# Patient Record
Sex: Female | Born: 1955 | Race: White | Hispanic: No | Marital: Married | Smoking: Never smoker
Health system: Southern US, Community
[De-identification: ages and names within clinical notes are randomized; demographics above are authoritative.]

## PROBLEM LIST (undated history)

## (undated) DIAGNOSIS — R7303 Prediabetes: Secondary | ICD-10-CM

## (undated) DIAGNOSIS — G4730 Sleep apnea, unspecified: Secondary | ICD-10-CM

## (undated) DIAGNOSIS — K219 Gastro-esophageal reflux disease without esophagitis: Secondary | ICD-10-CM

## (undated) DIAGNOSIS — J189 Pneumonia, unspecified organism: Secondary | ICD-10-CM

## (undated) DIAGNOSIS — I10 Essential (primary) hypertension: Secondary | ICD-10-CM

## (undated) DIAGNOSIS — Z9889 Other specified postprocedural states: Secondary | ICD-10-CM

## (undated) DIAGNOSIS — M1990 Unspecified osteoarthritis, unspecified site: Secondary | ICD-10-CM

## (undated) DIAGNOSIS — N189 Chronic kidney disease, unspecified: Secondary | ICD-10-CM

## (undated) DIAGNOSIS — Z87442 Personal history of urinary calculi: Secondary | ICD-10-CM

## (undated) HISTORY — PX: CHOLECYSTECTOMY: SHX55

## (undated) HISTORY — PX: ABDOMINAL HYSTERECTOMY: SUR658

## (undated) HISTORY — PX: OTHER SURGICAL HISTORY: SHX169

## (undated) HISTORY — PX: TURBINATE REDUCTION: SHX6157

## (undated) HISTORY — PX: PAROTIDECTOMY: SUR1003

## (undated) HISTORY — PX: BACK SURGERY: SHX140

---

## 2006-06-21 ENCOUNTER — Ambulatory Visit (HOSPITAL_COMMUNITY): Admission: RE | Admit: 2006-06-21 | Discharge: 2006-06-21 | Payer: Self-pay | Admitting: Orthopedic Surgery

## 2006-09-11 ENCOUNTER — Ambulatory Visit (HOSPITAL_COMMUNITY): Admission: RE | Admit: 2006-09-11 | Discharge: 2006-09-11 | Payer: Self-pay | Admitting: Orthopedic Surgery

## 2007-09-22 IMAGING — CR DG CHEST 2V
2 series · 2 of 2 positions shown · non-contrast
Comparison: none

CLINICAL DATA: Preoperative respiratory exam for knee surgery.  History of   hypertension and sleep apnea.
 CHEST - 2 VIEW:
 The heart size and mediastinal contours are within normal limits.  Both lungs are clear.  The visualized skeletal structures are unremarkable.

[view not recorded (1 of 2)]
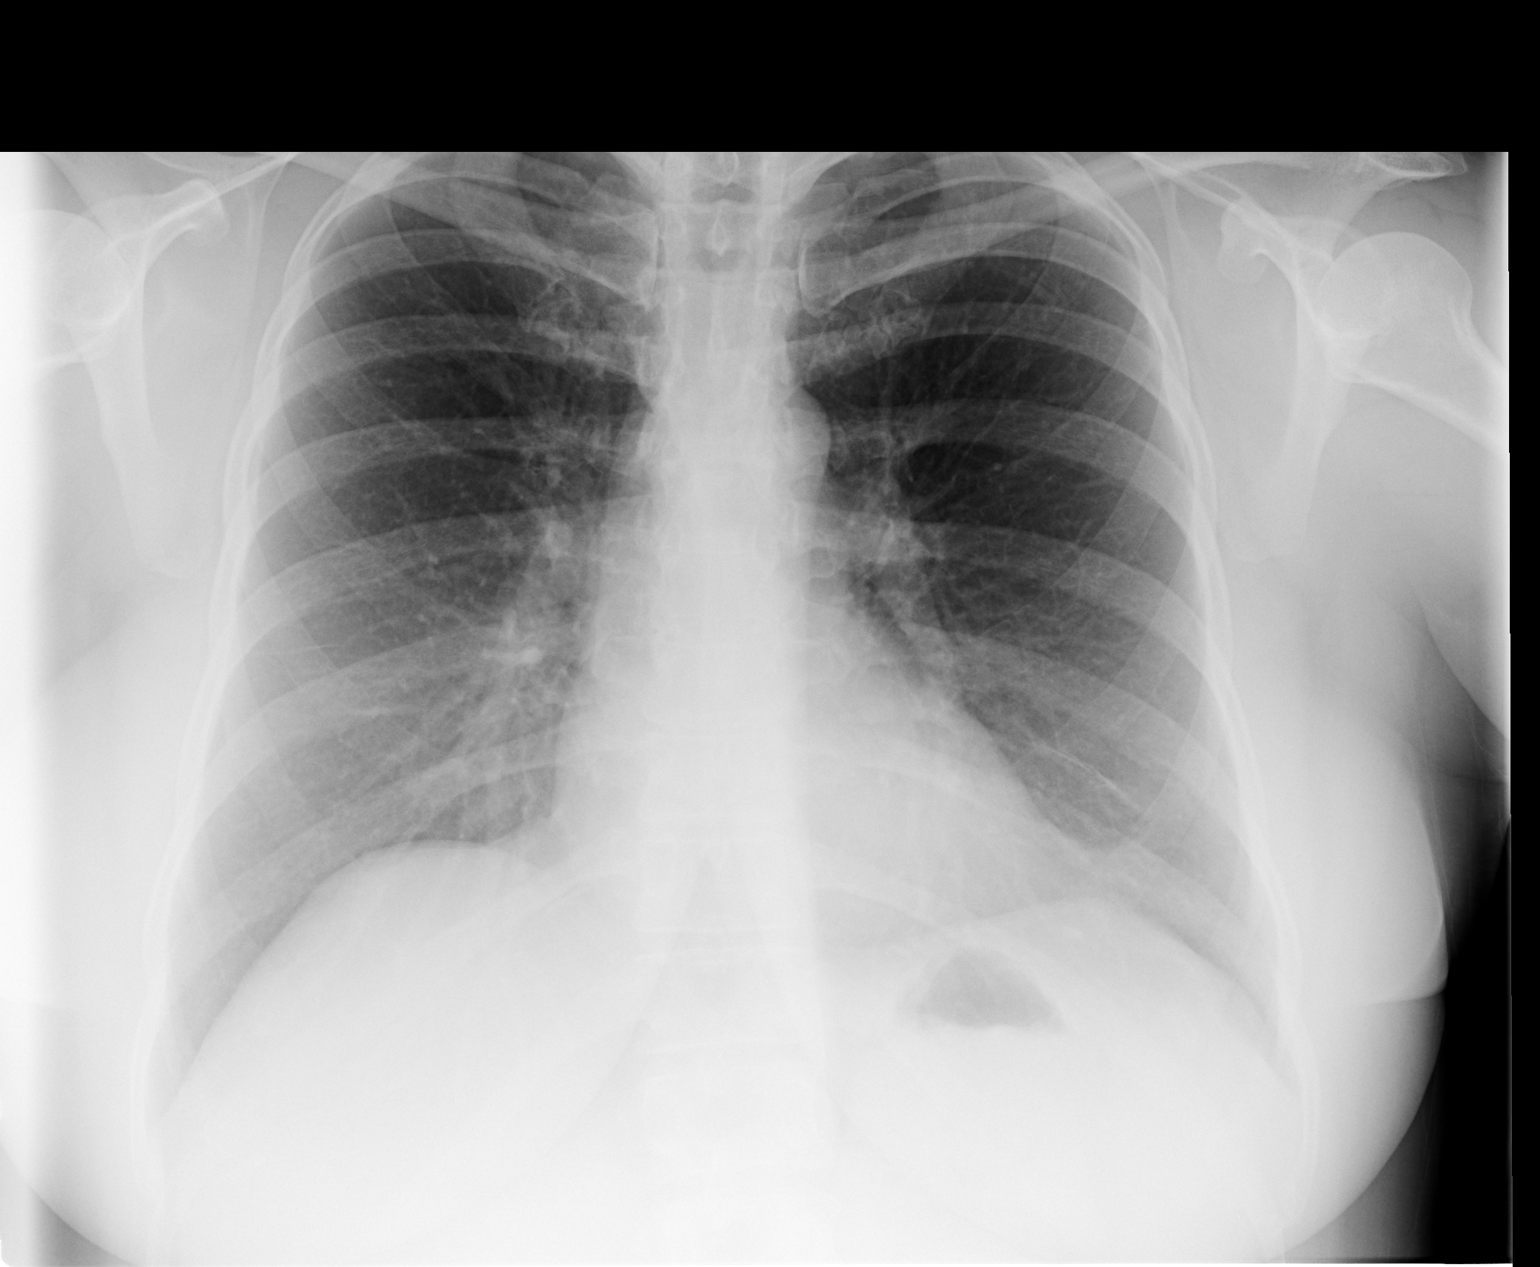

[view not recorded (2 of 2)]
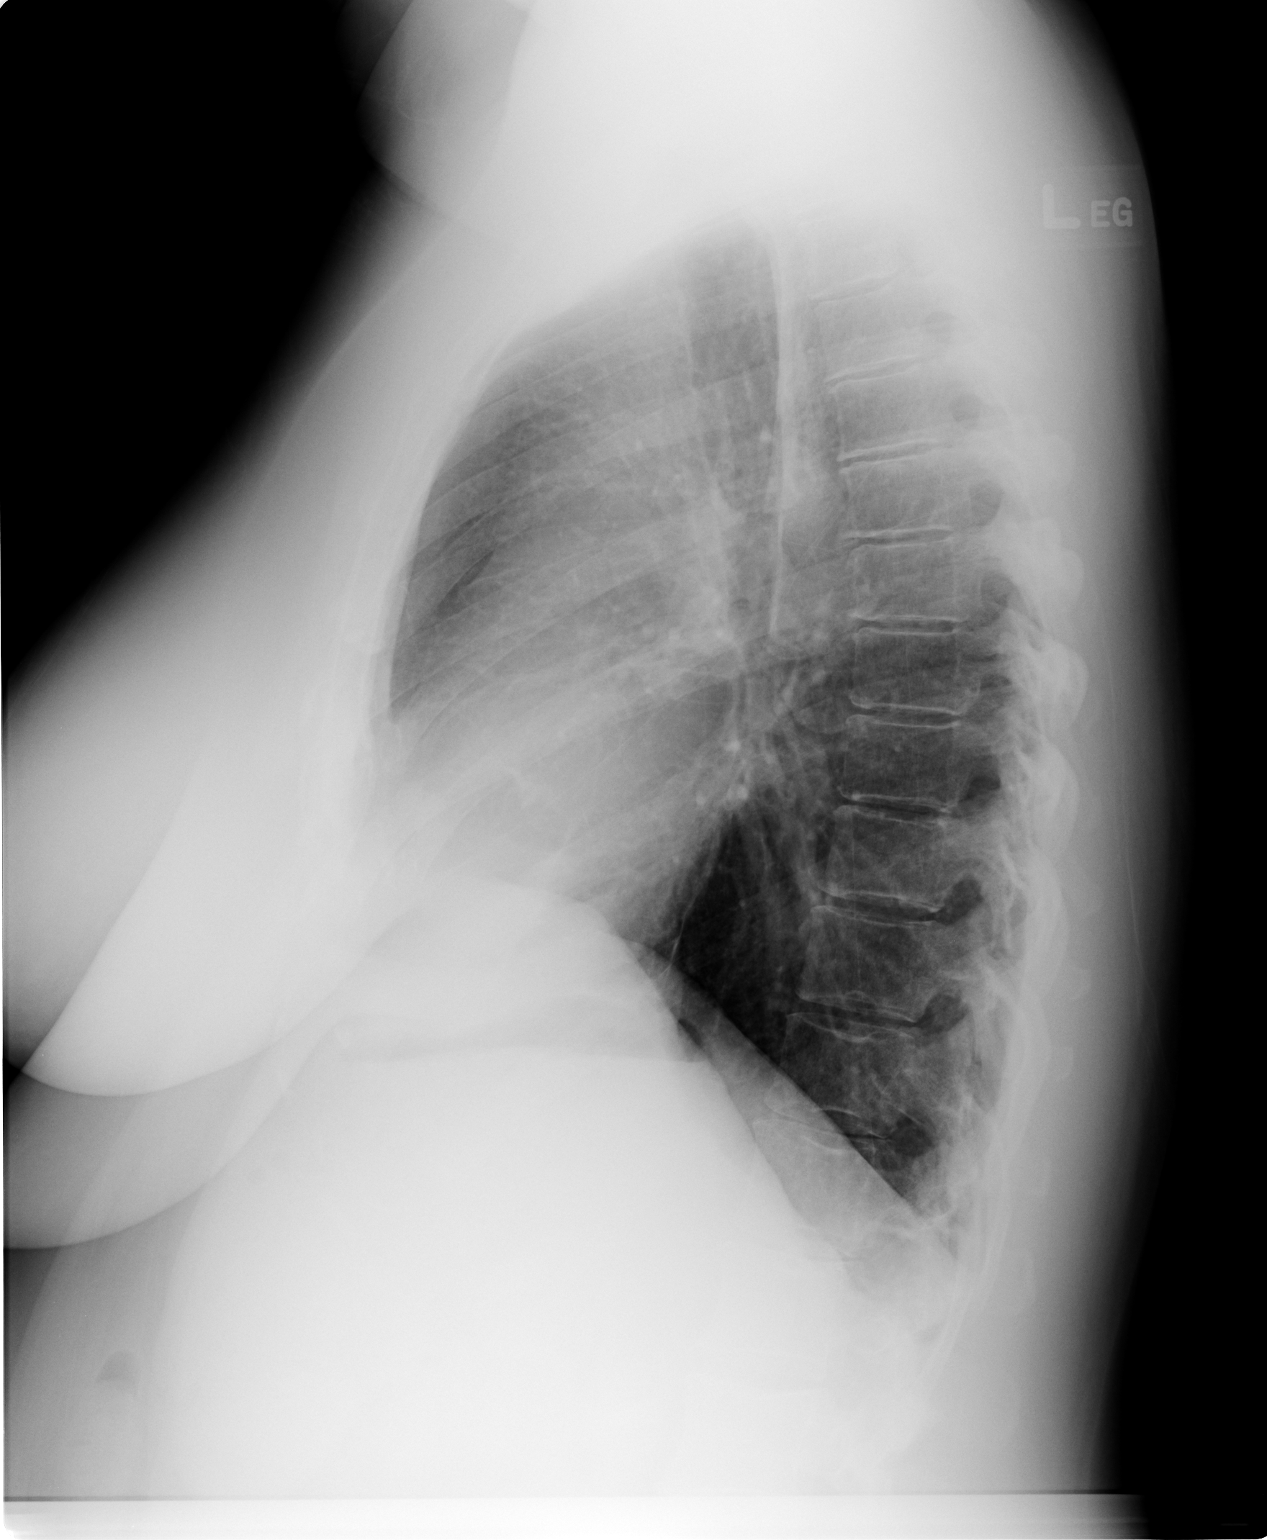

[2 of 2 positions shown; findings below may reference images not displayed]

IMPRESSION: No active cardiopulmonary disease.

## 2012-01-28 ENCOUNTER — Ambulatory Visit: Payer: BC Managed Care – PPO | Attending: Sports Medicine | Admitting: Physical Therapy

## 2012-01-28 DIAGNOSIS — M25559 Pain in unspecified hip: Secondary | ICD-10-CM | POA: Insufficient documentation

## 2012-01-28 DIAGNOSIS — M545 Low back pain, unspecified: Secondary | ICD-10-CM | POA: Insufficient documentation

## 2012-01-28 DIAGNOSIS — R269 Unspecified abnormalities of gait and mobility: Secondary | ICD-10-CM | POA: Insufficient documentation

## 2012-01-28 DIAGNOSIS — IMO0001 Reserved for inherently not codable concepts without codable children: Secondary | ICD-10-CM | POA: Insufficient documentation

## 2012-01-28 DIAGNOSIS — M6281 Muscle weakness (generalized): Secondary | ICD-10-CM | POA: Insufficient documentation

## 2012-01-30 ENCOUNTER — Ambulatory Visit: Payer: BC Managed Care – PPO | Attending: Sports Medicine | Admitting: Physical Therapy

## 2012-01-30 DIAGNOSIS — M6281 Muscle weakness (generalized): Secondary | ICD-10-CM | POA: Insufficient documentation

## 2012-01-30 DIAGNOSIS — M25559 Pain in unspecified hip: Secondary | ICD-10-CM | POA: Insufficient documentation

## 2012-01-30 DIAGNOSIS — R269 Unspecified abnormalities of gait and mobility: Secondary | ICD-10-CM | POA: Insufficient documentation

## 2012-01-30 DIAGNOSIS — M545 Low back pain, unspecified: Secondary | ICD-10-CM | POA: Insufficient documentation

## 2012-01-30 DIAGNOSIS — IMO0001 Reserved for inherently not codable concepts without codable children: Secondary | ICD-10-CM | POA: Insufficient documentation

## 2012-01-31 ENCOUNTER — Encounter: Payer: BC Managed Care – PPO | Admitting: Physical Therapy

## 2012-02-04 ENCOUNTER — Ambulatory Visit: Payer: BC Managed Care – PPO | Admitting: Physical Therapy

## 2012-02-06 ENCOUNTER — Ambulatory Visit: Payer: BC Managed Care – PPO | Admitting: Physical Therapy

## 2012-02-11 ENCOUNTER — Encounter: Payer: BC Managed Care – PPO | Admitting: Physical Therapy

## 2012-02-14 ENCOUNTER — Encounter: Payer: BC Managed Care – PPO | Admitting: Physical Therapy

## 2012-02-18 ENCOUNTER — Other Ambulatory Visit: Payer: Self-pay | Admitting: Sports Medicine

## 2012-02-18 DIAGNOSIS — M545 Low back pain: Secondary | ICD-10-CM

## 2012-02-23 ENCOUNTER — Other Ambulatory Visit: Payer: BC Managed Care – PPO

## 2021-11-25 ENCOUNTER — Other Ambulatory Visit: Payer: Self-pay

## 2021-11-25 DIAGNOSIS — M79641 Pain in right hand: Secondary | ICD-10-CM

## 2021-11-28 ENCOUNTER — Ambulatory Visit (INDEPENDENT_AMBULATORY_CARE_PROVIDER_SITE_OTHER): Payer: BC Managed Care – PPO | Admitting: Vascular Surgery

## 2021-11-28 ENCOUNTER — Ambulatory Visit (HOSPITAL_COMMUNITY)
Admission: RE | Admit: 2021-11-28 | Discharge: 2021-11-28 | Disposition: A | Discharge status: Home / Self Care | Payer: BC Managed Care – PPO | Source: Ambulatory Visit | Attending: Vascular Surgery | Admitting: Vascular Surgery

## 2021-11-28 ENCOUNTER — Encounter: Payer: Self-pay | Admitting: Vascular Surgery

## 2021-11-28 ENCOUNTER — Other Ambulatory Visit: Payer: Self-pay

## 2021-11-28 DIAGNOSIS — M79641 Pain in right hand: Secondary | ICD-10-CM | POA: Insufficient documentation

## 2021-11-28 DIAGNOSIS — M79644 Pain in right finger(s): Secondary | ICD-10-CM | POA: Insufficient documentation

## 2021-11-28 NOTE — Progress Notes (Signed)
Patient name: Meagan Walls MRN: [id number] DOB: 24-Aug-1956 Sex: female  REASON FOR CONSULT: Evaluate right middle finger discoloration  HPI: Meagan Walls is a 66 y.o. female, with history of hypertension that presents for evaluation of right middle finger discoloration.  She was initially seen by Dr. Orlan Leavens with hand surgery last week and I was contacted on call Friday.  Apparently last Wednesday she woke up with a blue right middle finger that felt cold.  She states this happened suddenly.  She states that she did not have significant pain but it took until Sunday almost 5 days for the color to get normal.  This has never happened before.  She denies any tobacco abuse.  No other history of thromboembolic events.  PMH: HTN  PSH: non-contributory  No family history on file.  SOCIAL HISTORY: Social History   Socioeconomic History   Marital status: Married    Spouse name: Not on file   Number of children: Not on file   Years of education: Not on file   Highest education level: Not on file  Occupational History   Not on file  Tobacco Use   Smoking status: Never   Smokeless tobacco: Never  Substance and Sexual Activity   Alcohol use: Never   Drug use: Never   Sexual activity: Not on file  Other Topics Concern   Not on file  Social History Narrative   Not on file   Social Determinants of Health   Financial Resource Strain: Not on file  Food Insecurity: Not on file  Transportation Needs: Not on file  Physical Activity: Not on file  Stress: Not on file  Social Connections: Not on file  Intimate Partner Violence: Not on file    Allergies  Allergen Reactions   Cetirizine Other (See Comments)   Cetirizine Hcl Other (See Comments)    Mouth sores   Erythromycin Base Other (See Comments) and Nausea And Vomiting   Latex Rash   Levofloxacin Other (See Comments)    Muscle pain   Molds & Smuts    Other    Short Ragweed Pollen Ext     No current outpatient  medications on file.   No current facility-administered medications for this visit.    REVIEW OF SYSTEMS:  [X]  denotes positive finding, [ ]  denotes negative finding Cardiac  Comments:  Chest pain or chest pressure:    Shortness of breath upon exertion:    Short of breath when lying flat:    Irregular heart rhythm:        Vascular    Pain in calf, thigh, or hip brought on by ambulation:    Pain in feet at night that wakes you up from your sleep:     Blood clot in your veins:    Leg swelling:         Pulmonary    Oxygen at home:    Productive cough:     Wheezing:         Neurologic    Sudden weakness in arms or legs:     Sudden numbness in arms or legs:     Sudden onset of difficulty speaking or slurred speech:    Temporary loss of vision in one eye:     Problems with dizziness:         Gastrointestinal    Blood in stool:     Vomited blood:         Genitourinary    Burning when urinating:  Blood in urine:        Psychiatric    Major depression:         Hematologic    Bleeding problems:    Problems with blood clotting too easily:        Skin    Rashes or ulcers:        Constitutional    Fever or chills:      PHYSICAL EXAM: Vitals:   11/28/21 1052  BP: 116/78  Pulse: 74  Resp: 14  Temp: 98.3 F (36.8 C)  TempSrc: Temporal  SpO2: 96%  Weight: 241 lb (109.3 kg)  Height: 5\' 3"  (1.6 m)    GENERAL: The patient is a well-nourished female, in no acute distress. The vital signs are documented above. CARDIAC: There is a regular rate and rhythm.  VASCULAR:  Right brachial and radial pulse palpable Right ulnar signal triphasic Right palmar arch signal triphasic Brisk digital signal in the right middle finger with Doppler Right middle finger normal color PULMONARY: No respiratory distress. ABDOMEN: Soft and non-tender. MUSCULOSKELETAL: There are no major deformities or cyanosis. NEUROLOGIC: No focal weakness or paresthesias are detected. SKIN: There  are no ulcers or rashes noted. PSYCHIATRIC: The patient has a normal affect.  DATA:   Right upper extremity arterial duplex shows triphasic waveforms throughout the subclavian, axillary, brachial, radial and ulnar artery with no occlusive disease.  Normal digital tracings in the right hand.     Assessment/Plan:  66 year old female presents for evaluation of a discolored right middle finger.  This happened acutely last Wednesday and took about 5 days to resolve.  Today she has normal coloration of the right middle finger.  She has a palpable radial pulse at the wrist.  She has triphasic ulnar signal at the wrist and a triphasic palmar arch signal as well in the right hand.  Her right upper extremity arterial duplex shows no evidence of inflow disease with normal pulsatile digit tracings as pictured above.  I am a little unclear about the etiology given we would expect Raynaud's from vasospasm to have resolved immediately and not last 5 days.  Discussed it would be appropriate to complete cardioembolic work-up with echocardiogram and CTA chest to ensure she does not have any other proximal lesion that could have been source for embolism.  We will order these through our office.  I will follow-up with her once CT chest and echo are done.  Discussed medical management with 81 mg aspirin daily. No indication for surgical intervention at this time.   Monday, MD [facility] Office: [phone]

## 2021-11-29 ENCOUNTER — Other Ambulatory Visit: Payer: Self-pay

## 2021-11-29 DIAGNOSIS — M79644 Pain in right finger(s): Secondary | ICD-10-CM

## 2021-11-30 ENCOUNTER — Other Ambulatory Visit: Payer: Self-pay

## 2021-11-30 DIAGNOSIS — M79644 Pain in right finger(s): Secondary | ICD-10-CM

## 2021-12-22 ENCOUNTER — Other Ambulatory Visit: Payer: BC Managed Care – PPO

## 2021-12-25 ENCOUNTER — Other Ambulatory Visit: Payer: BC Managed Care – PPO

## 2021-12-26 ENCOUNTER — Ambulatory Visit: Payer: BC Managed Care – PPO | Admitting: Vascular Surgery

## 2022-01-01 ENCOUNTER — Ambulatory Visit (HOSPITAL_COMMUNITY): Payer: BC Managed Care – PPO

## 2022-05-16 NOTE — Progress Notes (Addendum)
Anesthesia Review:  PCP: clearance 02/27/22 on chart - DR Oda Kilts  Cardiologist : none  Chest x-ray : EKG : 05/21/22  Echo : Stress test: Cardiac Cath :  Activity level: can do a flight of stairs without difficulty  Sleep Study/ CPAP : none  Fasting Blood Sugar :      / Checks Blood Sugar -- times a day:   Blood Thinner/ Instructions /Last Dose: ASA / Instructions/ Last Dose :   81 mg aspirin  Prediabetes- does not check gucose at home  Hgba1c- 05/21/22-  Pt was approx 30 minutes late for preop appt.  Med hx and instructions completed.

## 2022-05-17 NOTE — Patient Instructions (Addendum)
SURGICAL WAITING ROOM VISITATION Patients having surgery or a procedure may have no more than 2 support people in the waiting area - these visitors may rotate.   Children under the age of 58 must have an adult with them who is not the patient. If the patient needs to stay at the hospital during part of their recovery, the visitor guidelines for inpatient rooms apply. Pre-op nurse will coordinate an appropriate time for 1 support person to accompany patient in pre-op.  This support person may not rotate.    Please refer to the [facility] website for the visitor guidelines for Inpatients (after your surgery is over and you are in a regular room).       Your procedure is scheduled on:  05/29/22    Report to [facility] Main Entrance    Report to admitting at   [phone]   Call this number if you have problems the morning of surgery [phone]   Do not eat food :After Midnight.   After Midnight you may have the following liquids until __ 0415____ AM  DAY OF SURGERY  Water Non-Citrus Juices (without pulp, NO RED) Carbonated Beverages Black Coffee (NO MILK/CREAM OR CREAMERS, sugar ok)  Clear Tea (NO MILK/CREAM OR CREAMERS, sugar ok) regular and decaf                             Plain Jell-O (NO RED)                                           Fruit ices (not with fruit pulp, NO RED)                                     Popsicles (NO RED)                                                               Sports drinks like Gatorade (NO RED)                      The day of surgery:  Drink ONE (1) Pre-Surgery Clear Ensure or G2 at  0415 AM  ( have completed by ) the morning of surgery. Drink in one sitting. Do not sip.  This drink was given to you during your hospital  pre-op appointment visit. Nothing else to drink after completing the  Pre-Surgery Clear Ensure or G2.          If you have questions, please contact your surgeon's office.       Oral Hygiene is also important to  reduce your risk of infection.                                    Remember - BRUSH YOUR TEETH THE MORNING OF SURGERY WITH YOUR REGULAR TOOTHPASTE   Do NOT smoke after Midnight   Take these medicines the morning of surgery with A SIP OF WATER:   allegra, omeprazole  DO NOT TAKE ANY ORAL DIABETIC MEDICATIONS DAY OF YOUR SURGERY  Bring CPAP mask and tubing day of surgery.                              You may not have any metal on your body including hair pins, jewelry, and body piercing             Do not wear make-up, lotions, powders, perfumes/cologne, or deodorant  Do not wear nail polish including gel and S&S, artificial/acrylic nails, or any other type of covering on natural nails including finger and toenails. If you have artificial nails, gel coating, etc. that needs to be removed by a nail salon please have this removed prior to surgery or surgery may need to be canceled/ delayed if the surgeon/ anesthesia feels like they are unable to be safely monitored.   Do not shave  48 hours prior to surgery.               Men may shave face and neck.   Do not bring valuables to the hospital. [place].   Contacts, dentures or bridgework may not be worn into surgery.   Bring small overnight bag day of surgery.   DO NOT [place]. PHARMACY WILL DISPENSE MEDICATIONS LISTED ON YOUR MEDICATION LIST TO YOU DURING YOUR ADMISSION [place]!    Patients discharged on the day of surgery will not be allowed to drive home.  Someone NEEDS to stay with you for the first 24 hours after anesthesia.   Special Instructions: Bring a copy of your healthcare power of attorney and living will documents         the day of surgery if you haven't scanned them before.              Please read over the following fact sheets you were given: IF YOU HAVE QUESTIONS ABOUT YOUR PRE-OP INSTRUCTIONS PLEASE CALL [phone]      [facility] - Preparing for Surgery Before surgery, you can play an important role.  Because skin is not sterile, your skin needs to be as free of germs as possible.  You can reduce the number of germs on your skin by washing with CHG (chlorahexidine gluconate) soap before surgery.  CHG is an antiseptic cleaner which kills germs and bonds with the skin to continue killing germs even after washing. Please DO NOT use if you have an allergy to CHG or antibacterial soaps.  If your skin becomes reddened/irritated stop using the CHG and inform your nurse when you arrive at Short Stay. Do not shave (including legs and underarms) for at least 48 hours prior to the first CHG shower.  You may shave your face/neck. Please follow these instructions carefully:  1.  Shower with CHG Soap the night before surgery and the  morning of Surgery.  2.  If you choose to wash your hair, wash your hair first as usual with your  normal  shampoo.  3.  After you shampoo, rinse your hair and body thoroughly to remove the  shampoo.                           4.  Use CHG as you would any other liquid soap.  You can apply chg directly  to the skin and wash                       Gently with a scrungie or clean washcloth.  5.  Apply the CHG Soap to your body ONLY FROM THE NECK DOWN.   Do not use on face/ open                           Wound or open sores. Avoid contact with eyes, ears mouth and genitals (private parts).                       Wash face,  Genitals (private parts) with your normal soap.             6.  Wash thoroughly, paying special attention to the area where your surgery  will be performed.  7.  Thoroughly rinse your body with warm water from the neck down.  8.  DO NOT shower/wash with your normal soap after using and rinsing off  the CHG Soap.                9.  Pat yourself dry with a clean towel.            10.  Wear clean pajamas.            11.  Place clean sheets on your bed the night of your first shower and  do not  sleep with pets. Day of Surgery : Do not apply any lotions/deodorants the morning of surgery.  Please wear clean clothes to the hospital/surgery center.  FAILURE TO FOLLOW THESE INSTRUCTIONS MAY RESULT IN THE CANCELLATION OF YOUR SURGERY PATIENT SIGNATURE_________________________________  NURSE SIGNATURE__________________________________  ________________________________________________________________________

## 2022-05-21 ENCOUNTER — Encounter (HOSPITAL_COMMUNITY): Payer: Self-pay

## 2022-05-21 ENCOUNTER — Other Ambulatory Visit: Payer: Self-pay

## 2022-05-21 ENCOUNTER — Encounter (HOSPITAL_COMMUNITY)
Admission: RE | Admit: 2022-05-21 | Discharge: 2022-05-21 | Disposition: A | Discharge status: Home / Self Care | Payer: Medicare PPO | Source: Ambulatory Visit | Attending: Orthopedic Surgery | Admitting: Orthopedic Surgery

## 2022-05-21 VITALS — BP 148/89 | HR 75 | Temp 98.3°F | Resp 16 | Ht 63.0 in | Wt 224.0 lb

## 2022-05-21 DIAGNOSIS — Z01818 Encounter for other preprocedural examination: Secondary | ICD-10-CM | POA: Diagnosis not present

## 2022-05-21 DIAGNOSIS — M1712 Unilateral primary osteoarthritis, left knee: Secondary | ICD-10-CM | POA: Insufficient documentation

## 2022-05-21 HISTORY — DX: Prediabetes: R73.03

## 2022-05-21 HISTORY — DX: Essential (primary) hypertension: I10

## 2022-05-21 HISTORY — DX: Unspecified osteoarthritis, unspecified site: M19.90

## 2022-05-21 HISTORY — DX: Pneumonia, unspecified organism: J18.9

## 2022-05-21 HISTORY — DX: Chronic kidney disease, unspecified: N18.9

## 2022-05-21 HISTORY — DX: Personal history of urinary calculi: Z87.442

## 2022-05-21 HISTORY — DX: Other specified postprocedural states: Z98.890

## 2022-05-21 HISTORY — DX: Gastro-esophageal reflux disease without esophagitis: K21.9

## 2022-05-21 LAB — HEMOGLOBIN A1C
Hgb A1c MFr Bld: 5.8 % — ABNORMAL HIGH (ref 4.8–5.6)
Mean Plasma Glucose: 119.76 mg/dL

## 2022-05-21 LAB — BASIC METABOLIC PANEL
Anion gap: 9 (ref 5–15)
BUN: 24 mg/dL — ABNORMAL HIGH (ref 8–23)
CO2: 23 mmol/L (ref 22–32)
Calcium: 9.3 mg/dL (ref 8.9–10.3)
Chloride: 104 mmol/L (ref 98–111)
Creatinine, Ser: 1.02 mg/dL — ABNORMAL HIGH (ref 0.44–1.00)
GFR, Estimated: >60 mL/min (ref >60)
Glucose, Bld: 100 mg/dL — ABNORMAL HIGH (ref 70–99)
Potassium: 4 mmol/L (ref 3.5–5.1)
Sodium: 136 mmol/L (ref 135–145)

## 2022-05-21 LAB — GLUCOSE, CAPILLARY: Glucose-Capillary: 97 mg/dL (ref 70–99)

## 2022-05-21 LAB — CBC
HCT: 42.9 % (ref 36.0–46.0)
Hemoglobin: 14.4 g/dL (ref 12.0–15.0)
MCH: 31.2 pg (ref 26.0–34.0)
MCHC: 33.6 g/dL (ref 30.0–36.0)
MCV: 93.1 fL (ref 80.0–100.0)
Platelets: 257 10*3/uL (ref 150–400)
RBC: 4.61 MIL/uL (ref 3.87–5.11)
RDW: 12.9 % (ref 11.5–15.5)
WBC: 8.9 10*3/uL (ref 4.0–10.5)
nRBC: 0 % (ref 0.0–0.2)

## 2022-05-21 LAB — SURGICAL PCR SCREEN
MRSA, PCR: NEGATIVE
Staphylococcus aureus: NEGATIVE

## 2022-05-24 NOTE — H&P (Signed)
TOTAL KNEE ADMISSION H&P  Patient is being admitted for left total knee arthroplasty.  Subjective:  Chief Complaint:left knee pain.  HPI: Meagan Walls, 66 y.o. female, has a history of pain and functional disability in the left knee due to arthritis and has failed non-surgical conservative treatments for greater than 12 weeks to includeNSAID's and/or analgesics, corticosteriod injections, viscosupplementation injections, weight reduction as appropriate, and activity modification.  Onset of symptoms was gradual, starting 2 years ago with gradually worsening course since that time. The patient noted no past surgery on the left knee(s).  Patient currently rates pain in the left knee(s) at 8 out of 10 with activity. Patient has worsening of pain with activity and weight bearing, pain that interferes with activities of daily living, and pain with passive range of motion.  Patient has evidence of joint space narrowing by imaging studies. There is no active infection.  Patient Active Problem List   Diagnosis Date Noted   Finger pain, right 11/28/2021   Past Medical History:  Diagnosis Date   Arthritis    Chronic kidney disease    GERD (gastroesophageal reflux disease)    History of kidney stones    Hypertension    Pneumonia    PONV (postoperative nausea and vomiting)    Pre-diabetes     Past Surgical History:  Procedure Laterality Date   ABDOMINAL HYSTERECTOMY     BACK SURGERY     CESAREAN SECTION     CHOLECYSTECTOMY     knee scope surgery      myomectomy     PAROTIDECTOMY     shoulder scope surgery      TURBINATE REDUCTION      No current facility-administered medications for this encounter.   Current Outpatient Medications  Medication Sig Dispense Refill Last Dose   albuterol (VENTOLIN HFA) 108 (90 Base) MCG/ACT inhaler Inhale 2 puffs into the lungs every 6 (six) hours as needed for wheezing or shortness of breath.      aspirin EC 81 MG tablet Take 81 mg by mouth daily.  Swallow whole.      atorvastatin (LIPITOR) 10 MG tablet Take 10 mg by mouth every evening.      Cholecalciferol (VITAMIN D) 50 MCG (2000 UT) tablet Take 4,000 Units by mouth daily.      COLLAGEN PO Take 2 Scoops by mouth daily.      diclofenac (VOLTAREN) 75 MG EC tablet Take 75 mg by mouth 2 (two) times daily.      diphenhydrAMINE (BENADRYL) 25 MG tablet Take 25 mg by mouth at bedtime as needed for allergies.      estradiol (ESTRACE) 0.1 MG/GM vaginal cream Place 1 Applicatorful vaginally once a week.      famotidine-calcium carbonate-magnesium hydroxide (PEPCID COMPLETE) 10-800-165 MG chewable tablet Chew 1 tablet by mouth daily as needed (acid reflux).      fexofenadine (ALLEGRA) 180 MG tablet Take 180 mg by mouth daily.      Guaifenesin (MUCINEX MAXIMUM STRENGTH) 1200 MG TB12 Take 1,200 mg by mouth daily as needed (congestion).      HYDROcodone-acetaminophen (NORCO/VICODIN) 5-325 MG tablet Take 1 tablet by mouth every 8 (eight) hours as needed for moderate pain.      lisinopril-hydrochlorothiazide (ZESTORETIC) 20-25 MG tablet Take 1 tablet by mouth daily.      MAGNESIUM GLYCINATE PO Take 240 mg by mouth daily.      Melatonin 5 MG CAPS Take 5 mg by mouth daily.      metFORMIN (GLUCOPHAGE)  500 MG tablet Take 500 mg by mouth daily.      methocarbamol (ROBAXIN) 500 MG tablet Take 500 mg by mouth See admin instructions. Take 500 mg every evening, may take an additional 500 mg up to 3 more times daily as needed muscle spasms      omeprazole (PRILOSEC) 20 MG capsule Take 20 mg by mouth daily as needed (acid relfux).      OVER THE COUNTER MEDICATION Take 0.25-0.5 capsules by mouth daily as needed (sleep). CBD gummies      oxymetazoline (AFRIN) 0.05 % nasal spray Place 1 spray into both nostrils 2 (two) times daily as needed for congestion.      SUPER B COMPLEX/C PO Take 1 tablet by mouth daily.      traMADol (ULTRAM) 50 MG tablet Take 50 mg by mouth 2 (two) times daily as needed for moderate pain.       triamcinolone (NASACORT ALLERGY 24HR) 55 MCG/ACT AERO nasal inhaler Place 2 sprays into the nose every evening.      TURMERIC PO Take 1,000 mg by mouth 2 (two) times daily.      Allergies  Allergen Reactions   Cetirizine Hcl Other (See Comments)    Mouth sores   Erythromycin Base Nausea And Vomiting   Levofloxacin Other (See Comments)    Muscle pain   Molds & Smuts    Short Ragweed Pollen Ext    Tape Rash    When left on for extended periods     Social History   Tobacco Use   Smoking status: Never   Smokeless tobacco: Never  Substance Use Topics   Alcohol use: Yes    Comment: occas    No family history on file.   Review of Systems  Constitutional:  Negative for chills and fever.  Respiratory:  Negative for cough and shortness of breath.   Cardiovascular:  Negative for chest pain.  Gastrointestinal:  Negative for nausea and vomiting.  Musculoskeletal:  Positive for arthralgias.     Objective:  Physical Exam Well nourished and well developed. General: Alert and oriented x3, cooperative and pleasant, no acute distress. Head: normocephalic, atraumatic, neck supple. Eyes: EOMI.  Musculoskeletal: Bilateral knee exams: No palpable effusions, warmth erythema Slight flexion contractures with flexion over 110 degrees Tenderness over the medial and anterior aspect knees No significant lower extremity edema or calf tenderness  Calves soft and nontender. Motor function intact in LE. Strength 5/5 LE bilaterally. Neuro: Distal pulses 2+. Sensation to light touch intact in LE.  Vital signs in last 24 hours:    Labs:   Estimated body mass index is 39.68 kg/m as calculated from the following:   Height as of 05/21/22: 5\' 3"  (1.6 m).   Weight as of 05/21/22: 101.6 kg.   Imaging Review Plain radiographs demonstrate severe degenerative joint disease of the left knee(s). The overall alignment isneutral. The bone quality appears to be adequate for age and reported activity  level.      Assessment/Plan:  End stage arthritis, left knee   The patient history, physical examination, clinical judgment of the provider and imaging studies are consistent with end stage degenerative joint disease of the left knee(s) and total knee arthroplasty is deemed medically necessary. The treatment options including medical management, injection therapy arthroscopy and arthroplasty were discussed at length. The risks and benefits of total knee arthroplasty were presented and reviewed. The risks due to aseptic loosening, infection, stiffness, patella tracking problems, thromboembolic complications and other imponderables were  discussed. The patient acknowledged the explanation, agreed to proceed with the plan and consent was signed. Patient is being admitted for inpatient treatment for surgery, pain control, PT, OT, prophylactic antibiotics, VTE prophylaxis, progressive ambulation and ADL's and discharge planning. The patient is planning to be discharged  home.  Therapy Plans: outpatient therapy at [facility] in [place] Disposition: Home with husband Planned DVT Prophylaxis: aspirin 81mg  BID DME needed: walker PCP: Dr. , clearance received TXA: IV Allergies: erythromycin - abdominal pain, zyrtec - , morphine - N/V Anesthesia Concerns: none BMI: 39.7 Last HgbA1c: Pre-diabetic, 6.4%   Other: - Will try aquacel - has sensitive skin - oxycodone, robaxin, tylenol, celebrex - Lost 7 lbs in the past 2-3 weeks! > congratulate    Patient's anticipated LOS is less than 2 midnights, meeting these requirements: - Younger than 81 - Lives within 1 hour of care - Has a competent adult at home to recover with post-op recover - NO history of  - Chronic pain requiring opiods  - Diabetes  - Coronary Artery Disease  - Heart failure  - Heart attack  - Stroke  - DVT/VTE  - Cardiac arrhythmia  - Respiratory Failure/COPD  - Renal failure  - Anemia  - Advanced Liver  disease  76, PA-C Orthopedic Surgery [facility] [place] [phone]

## 2022-05-28 ENCOUNTER — Encounter (HOSPITAL_COMMUNITY): Payer: Self-pay | Admitting: Orthopedic Surgery

## 2022-05-28 NOTE — Anesthesia Preprocedure Evaluation (Signed)
Anesthesia Evaluation  Patient identified by MRN, date of birth, ID band Patient awake    Reviewed: Allergy & Precautions, NPO status , Patient's Chart, lab work & pertinent test results  History of Anesthesia Complications (+) PONV  Airway Mallampati: III  TM Distance: >3 FB Neck ROM: Full    Dental  (+) Teeth Intact, Dental Advisory Given   Pulmonary    breath sounds clear to auscultation       Cardiovascular hypertension, Pt. on medications  Rhythm:Regular Rate:Normal     Neuro/Psych negative neurological ROS  negative psych ROS   GI/Hepatic Neg liver ROS, GERD  Medicated,  Endo/Other  diabetes, Type 2, Oral Hypoglycemic Agents  Renal/GU Renal disease     Musculoskeletal  (+) Arthritis ,   Abdominal Normal abdominal exam  (+)   Peds  Hematology negative hematology ROS (+)   Anesthesia Other Findings   Reproductive/Obstetrics                            Anesthesia Physical Anesthesia Plan  ASA: 2  Anesthesia Plan: Spinal   Post-op Pain Management: Regional block*   Induction: Intravenous  PONV Risk Score and Plan: 3 and Ondansetron, Dexamethasone, Propofol infusion and Midazolam  Airway Management Planned: Natural Airway and Simple Face Mask  Additional Equipment: None  Intra-op Plan:   Post-operative Plan:   Informed Consent: I have reviewed the patients History and Physical, chart, labs and discussed the procedure including the risks, benefits and alternatives for the proposed anesthesia with the patient or authorized representative who has indicated his/her understanding and acceptance.     Dental advisory given  Plan Discussed with:   Anesthesia Plan Comments: (Lab Results      Component                Value               Date                      WBC                      8.9                 05/21/2022                HGB                      14.4                 05/21/2022                HCT                      42.9                05/21/2022                MCV                      93.1                05/21/2022                PLT                      257  05/21/2022           )       Anesthesia Quick Evaluation

## 2022-05-29 ENCOUNTER — Ambulatory Visit (HOSPITAL_COMMUNITY): Payer: Medicare PPO | Admitting: Physician Assistant

## 2022-05-29 ENCOUNTER — Other Ambulatory Visit: Payer: Self-pay

## 2022-05-29 ENCOUNTER — Encounter (HOSPITAL_COMMUNITY)
Admission: RE | Disposition: A | Discharge status: Home / Self Care | Payer: Self-pay | Source: Home / Self Care | Attending: Orthopedic Surgery

## 2022-05-29 ENCOUNTER — Encounter (HOSPITAL_COMMUNITY): Payer: Self-pay | Admitting: Orthopedic Surgery

## 2022-05-29 ENCOUNTER — Observation Stay (HOSPITAL_COMMUNITY)
Admission: RE | Admit: 2022-05-29 | Discharge: 2022-05-30 | Disposition: A | Discharge status: Home / Self Care | Payer: Medicare PPO | Attending: Orthopedic Surgery | Admitting: Orthopedic Surgery

## 2022-05-29 ENCOUNTER — Ambulatory Visit (HOSPITAL_BASED_OUTPATIENT_CLINIC_OR_DEPARTMENT_OTHER): Payer: Medicare PPO | Admitting: Anesthesiology

## 2022-05-29 DIAGNOSIS — Z79899 Other long term (current) drug therapy: Secondary | ICD-10-CM | POA: Insufficient documentation

## 2022-05-29 DIAGNOSIS — M1712 Unilateral primary osteoarthritis, left knee: Secondary | ICD-10-CM | POA: Diagnosis not present

## 2022-05-29 DIAGNOSIS — N189 Chronic kidney disease, unspecified: Secondary | ICD-10-CM | POA: Diagnosis not present

## 2022-05-29 DIAGNOSIS — Z7984 Long term (current) use of oral hypoglycemic drugs: Secondary | ICD-10-CM | POA: Insufficient documentation

## 2022-05-29 DIAGNOSIS — Z7982 Long term (current) use of aspirin: Secondary | ICD-10-CM | POA: Insufficient documentation

## 2022-05-29 DIAGNOSIS — Z96652 Presence of left artificial knee joint: Secondary | ICD-10-CM

## 2022-05-29 DIAGNOSIS — I129 Hypertensive chronic kidney disease with stage 1 through stage 4 chronic kidney disease, or unspecified chronic kidney disease: Secondary | ICD-10-CM | POA: Insufficient documentation

## 2022-05-29 DIAGNOSIS — Z01818 Encounter for other preprocedural examination: Secondary | ICD-10-CM

## 2022-05-29 HISTORY — PX: TOTAL KNEE ARTHROPLASTY: SHX125

## 2022-05-29 LAB — TYPE AND SCREEN
ABO/RH(D): A POS
Antibody Screen: NEGATIVE

## 2022-05-29 SURGERY — ARTHROPLASTY, KNEE, TOTAL
Anesthesia: Spinal | Site: Knee | Laterality: Left

## 2022-05-29 MED ORDER — MEPERIDINE HCL 50 MG/ML IJ SOLN: 6.2500 mg | INTRAMUSCULAR | Status: DC | PRN

## 2022-05-29 MED ORDER — SODIUM CHLORIDE (PF) 0.9 % IJ SOLN
INTRAMUSCULAR | Status: DC | PRN
  Administered 2022-05-29: 30 mL

## 2022-05-29 MED ORDER — LISINOPRIL 20 MG PO TABS
20.0000 mg | ORAL_TABLET | Freq: Every day | ORAL | Status: DC
  Administered 2022-05-30: 20 mg via ORAL
  Filled 2022-05-29: qty 1

## 2022-05-29 MED ORDER — LORATADINE 10 MG PO TABS
10.0000 mg | ORAL_TABLET | Freq: Every day | ORAL | Status: DC
  Administered 2022-05-30: 10 mg via ORAL
  Filled 2022-05-29: qty 1

## 2022-05-29 MED ORDER — PANTOPRAZOLE SODIUM 40 MG PO TBEC
40.0000 mg | DELAYED_RELEASE_TABLET | Freq: Every day | ORAL | Status: DC
  Administered 2022-05-30: 40 mg via ORAL
  Filled 2022-05-29: qty 1

## 2022-05-29 MED ORDER — ASPIRIN 81 MG PO CHEW
81.0000 mg | CHEWABLE_TABLET | Freq: Two times a day (BID) | ORAL | Status: DC
  Administered 2022-05-29 – 2022-05-30 (×2): 81 mg via ORAL
  Filled 2022-05-29 (×2): qty 1

## 2022-05-29 MED ORDER — DEXAMETHASONE SODIUM PHOSPHATE 10 MG/ML IJ SOLN
10.0000 mg | Freq: Once | INTRAMUSCULAR | Status: AC
Start: 2022-05-30 — End: 2022-05-30
  Administered 2022-05-30: 10 mg via INTRAVENOUS
  Filled 2022-05-29: qty 1

## 2022-05-29 MED ORDER — TRANEXAMIC ACID-NACL 1000-0.7 MG/100ML-% IV SOLN
1000.0000 mg | INTRAVENOUS | Status: AC
  Administered 2022-05-29: 1000 mg via INTRAVENOUS
  Filled 2022-05-29: qty 100

## 2022-05-29 MED ORDER — DEXMEDETOMIDINE (PRECEDEX) IN NS 20 MCG/5ML (4 MCG/ML) IV SYRINGE
PREFILLED_SYRINGE | INTRAVENOUS | Status: DC | PRN
  Administered 2022-05-29: 12 ug via INTRAVENOUS

## 2022-05-29 MED ORDER — ORAL CARE MOUTH RINSE: 15.0000 mL | Freq: Once | OROMUCOSAL | Status: AC

## 2022-05-29 MED ORDER — DIPHENHYDRAMINE HCL 25 MG PO TABS: 25.0000 mg | ORAL_TABLET | Freq: Every evening | ORAL | Status: DC | PRN

## 2022-05-29 MED ORDER — MELATONIN 5 MG PO TABS
5.0000 mg | ORAL_TABLET | Freq: Every day | ORAL | Status: DC
Start: 2022-05-29 — End: 2022-05-30
  Administered 2022-05-29: 5 mg via ORAL
  Filled 2022-05-29: qty 1

## 2022-05-29 MED ORDER — PHENYLEPHRINE HCL-NACL 20-0.9 MG/250ML-% IV SOLN
INTRAVENOUS | Status: DC | PRN
  Administered 2022-05-29: 25 ug/min via INTRAVENOUS

## 2022-05-29 MED ORDER — KETOROLAC TROMETHAMINE 30 MG/ML IJ SOLN
INTRAMUSCULAR | Status: AC
  Filled 2022-05-29: qty 1

## 2022-05-29 MED ORDER — FENTANYL CITRATE (PF) 100 MCG/2ML IJ SOLN
INTRAMUSCULAR | Status: DC | PRN
  Administered 2022-05-29 (×2): 50 ug via INTRAVENOUS

## 2022-05-29 MED ORDER — SODIUM CHLORIDE 0.9 % IR SOLN
Status: DC | PRN
  Administered 2022-05-29: 1000 mL

## 2022-05-29 MED ORDER — MIDAZOLAM HCL 2 MG/2ML IJ SOLN
INTRAMUSCULAR | Status: DC | PRN
  Administered 2022-05-29: 2 mg via INTRAVENOUS

## 2022-05-29 MED ORDER — GUAIFENESIN ER 600 MG PO TB12: 1200.0000 mg | ORAL_TABLET | Freq: Every day | ORAL | Status: DC | PRN

## 2022-05-29 MED ORDER — DIPHENHYDRAMINE HCL 12.5 MG/5ML PO ELIX: 12.5000 mg | ORAL_SOLUTION | ORAL | Status: DC | PRN

## 2022-05-29 MED ORDER — ROPIVACAINE HCL 5 MG/ML IJ SOLN
INTRAMUSCULAR | Status: DC | PRN
  Administered 2022-05-29: 30 mL via PERINEURAL

## 2022-05-29 MED ORDER — PROPOFOL 10 MG/ML IV BOLUS
INTRAVENOUS | Status: AC
  Filled 2022-05-29: qty 20

## 2022-05-29 MED ORDER — DOCUSATE SODIUM 100 MG PO CAPS
100.0000 mg | ORAL_CAPSULE | Freq: Two times a day (BID) | ORAL | Status: DC
  Administered 2022-05-29 – 2022-05-30 (×2): 100 mg via ORAL
  Filled 2022-05-29 (×2): qty 1

## 2022-05-29 MED ORDER — ACETAMINOPHEN 160 MG/5ML PO SOLN: 325.0000 mg | Freq: Once | ORAL | Status: DC | PRN

## 2022-05-29 MED ORDER — METHOCARBAMOL 500 MG PO TABS
500.0000 mg | ORAL_TABLET | Freq: Four times a day (QID) | ORAL | Status: DC | PRN
  Administered 2022-05-29 – 2022-05-30 (×3): 500 mg via ORAL
  Filled 2022-05-29 (×3): qty 1

## 2022-05-29 MED ORDER — METOCLOPRAMIDE HCL 5 MG/ML IJ SOLN
5.0000 mg | Freq: Three times a day (TID) | INTRAMUSCULAR | Status: DC | PRN
  Administered 2022-05-29 – 2022-05-30 (×2): 10 mg via INTRAVENOUS
  Filled 2022-05-29 (×2): qty 2

## 2022-05-29 MED ORDER — OXYMETAZOLINE HCL 0.05 % NA SOLN
1.0000 | Freq: Two times a day (BID) | NASAL | Status: DC | PRN
  Administered 2022-05-30: 1 via NASAL
  Filled 2022-05-29: qty 15

## 2022-05-29 MED ORDER — ONDANSETRON HCL 4 MG PO TABS
4.0000 mg | ORAL_TABLET | Freq: Four times a day (QID) | ORAL | Status: DC | PRN
  Administered 2022-05-29 – 2022-05-30 (×2): 4 mg via ORAL
  Filled 2022-05-29 (×2): qty 1

## 2022-05-29 MED ORDER — SODIUM CHLORIDE (PF) 0.9 % IJ SOLN
INTRAMUSCULAR | Status: AC
  Filled 2022-05-29: qty 30

## 2022-05-29 MED ORDER — KETOROLAC TROMETHAMINE 30 MG/ML IJ SOLN
INTRAMUSCULAR | Status: DC | PRN
  Administered 2022-05-29: 30 mg

## 2022-05-29 MED ORDER — AMISULPRIDE (ANTIEMETIC) 5 MG/2ML IV SOLN: 10.0000 mg | Freq: Once | INTRAVENOUS | Status: DC | PRN

## 2022-05-29 MED ORDER — CHLORHEXIDINE GLUCONATE 0.12 % MT SOLN
15.0000 mL | Freq: Once | OROMUCOSAL | Status: AC
  Administered 2022-05-29: 15 mL via OROMUCOSAL

## 2022-05-29 MED ORDER — FERROUS SULFATE 325 (65 FE) MG PO TABS
325.0000 mg | ORAL_TABLET | Freq: Three times a day (TID) | ORAL | Status: DC
  Administered 2022-05-30 (×2): 325 mg via ORAL
  Filled 2022-05-29 (×2): qty 1

## 2022-05-29 MED ORDER — PROPOFOL 500 MG/50ML IV EMUL
INTRAVENOUS | Status: DC | PRN
  Administered 2022-05-29: 75 ug/kg/min via INTRAVENOUS
  Administered 2022-05-29: 30 mg via INTRAVENOUS

## 2022-05-29 MED ORDER — BUPIVACAINE-EPINEPHRINE (PF) 0.25% -1:200000 IJ SOLN
INTRAMUSCULAR | Status: DC | PRN
  Administered 2022-05-29: 30 mL

## 2022-05-29 MED ORDER — FAMOTIDINE 10 MG PO TABS: 10.0000 mg | ORAL_TABLET | Freq: Every day | ORAL | Status: DC | PRN

## 2022-05-29 MED ORDER — ATORVASTATIN CALCIUM 10 MG PO TABS
10.0000 mg | ORAL_TABLET | Freq: Every evening | ORAL | Status: DC
  Administered 2022-05-29: 10 mg via ORAL
  Filled 2022-05-29: qty 1

## 2022-05-29 MED ORDER — MENTHOL 3 MG MT LOZG
1.0000 | LOZENGE | OROMUCOSAL | Status: DC | PRN
  Filled 2022-05-29: qty 9

## 2022-05-29 MED ORDER — METFORMIN HCL 500 MG PO TABS
500.0000 mg | ORAL_TABLET | Freq: Every day | ORAL | Status: DC
  Administered 2022-05-30: 500 mg via ORAL
  Filled 2022-05-29: qty 1

## 2022-05-29 MED ORDER — ACETAMINOPHEN 10 MG/ML IV SOLN: 1000.0000 mg | Freq: Once | INTRAVENOUS | Status: DC | PRN

## 2022-05-29 MED ORDER — BUPIVACAINE IN DEXTROSE 0.75-8.25 % IT SOLN
INTRATHECAL | Status: DC | PRN
  Administered 2022-05-29: 1.6 mL via INTRATHECAL

## 2022-05-29 MED ORDER — 0.9 % SODIUM CHLORIDE (POUR BTL) OPTIME
TOPICAL | Status: DC | PRN
  Administered 2022-05-29: 1000 mL

## 2022-05-29 MED ORDER — TRANEXAMIC ACID-NACL 1000-0.7 MG/100ML-% IV SOLN
1000.0000 mg | Freq: Once | INTRAVENOUS | Status: AC
  Administered 2022-05-29: 1000 mg via INTRAVENOUS
  Filled 2022-05-29: qty 100

## 2022-05-29 MED ORDER — PHENYLEPHRINE HCL-NACL 20-0.9 MG/250ML-% IV SOLN
INTRAVENOUS | Status: AC
  Filled 2022-05-29: qty 500

## 2022-05-29 MED ORDER — LACTATED RINGERS IV SOLN: INTRAVENOUS | Status: DC

## 2022-05-29 MED ORDER — PHENOL 1.4 % MT LIQD
1.0000 | OROMUCOSAL | Status: DC | PRN
  Filled 2022-05-29: qty 177

## 2022-05-29 MED ORDER — SODIUM CHLORIDE 0.9 % IV SOLN: INTRAVENOUS | Status: DC

## 2022-05-29 MED ORDER — ACETAMINOPHEN 325 MG PO TABS: 325.0000 mg | ORAL_TABLET | Freq: Once | ORAL | Status: DC | PRN

## 2022-05-29 MED ORDER — HYDROCHLOROTHIAZIDE 25 MG PO TABS
25.0000 mg | ORAL_TABLET | Freq: Every day | ORAL | Status: DC
  Administered 2022-05-30: 25 mg via ORAL
  Filled 2022-05-29: qty 1

## 2022-05-29 MED ORDER — HYDROMORPHONE HCL 1 MG/ML IJ SOLN
0.5000 mg | INTRAMUSCULAR | Status: DC | PRN
  Administered 2022-05-29: 1 mg via INTRAVENOUS
  Filled 2022-05-29: qty 1

## 2022-05-29 MED ORDER — POLYETHYLENE GLYCOL 3350 17 G PO PACK: 17.0000 g | PACK | Freq: Every day | ORAL | Status: DC | PRN

## 2022-05-29 MED ORDER — FENTANYL CITRATE (PF) 100 MCG/2ML IJ SOLN
INTRAMUSCULAR | Status: AC
  Filled 2022-05-29: qty 2

## 2022-05-29 MED ORDER — CEFAZOLIN SODIUM-DEXTROSE 2-4 GM/100ML-% IV SOLN
2.0000 g | INTRAVENOUS | Status: AC
  Administered 2022-05-29: 2 g via INTRAVENOUS
  Filled 2022-05-29: qty 100

## 2022-05-29 MED ORDER — OXYCODONE HCL 5 MG PO TABS
10.0000 mg | ORAL_TABLET | ORAL | Status: DC | PRN
  Administered 2022-05-30: 10 mg via ORAL
  Filled 2022-05-29 (×2): qty 2

## 2022-05-29 MED ORDER — OXYCODONE HCL 5 MG PO TABS
5.0000 mg | ORAL_TABLET | ORAL | Status: DC | PRN
  Administered 2022-05-29: 5 mg via ORAL
  Administered 2022-05-29 – 2022-05-30 (×3): 10 mg via ORAL
  Filled 2022-05-29: qty 1
  Filled 2022-05-29 (×3): qty 2

## 2022-05-29 MED ORDER — DEXAMETHASONE SODIUM PHOSPHATE 10 MG/ML IJ SOLN: 8.0000 mg | Freq: Once | INTRAMUSCULAR | Status: DC

## 2022-05-29 MED ORDER — METOCLOPRAMIDE HCL 5 MG PO TABS: 5.0000 mg | ORAL_TABLET | Freq: Three times a day (TID) | ORAL | Status: DC | PRN

## 2022-05-29 MED ORDER — ONDANSETRON HCL 4 MG/2ML IJ SOLN: 4.0000 mg | Freq: Four times a day (QID) | INTRAMUSCULAR | Status: DC | PRN

## 2022-05-29 MED ORDER — HYDROMORPHONE HCL 1 MG/ML IJ SOLN: 0.2500 mg | INTRAMUSCULAR | Status: DC | PRN

## 2022-05-29 MED ORDER — ACETAMINOPHEN 500 MG PO TABS
1000.0000 mg | ORAL_TABLET | Freq: Four times a day (QID) | ORAL | Status: DC
  Administered 2022-05-29 – 2022-05-30 (×4): 1000 mg via ORAL
  Filled 2022-05-29 (×4): qty 2

## 2022-05-29 MED ORDER — TRIAMCINOLONE ACETONIDE 55 MCG/ACT NA AERO
2.0000 | INHALATION_SPRAY | Freq: Every evening | NASAL | Status: DC
  Administered 2022-05-29: 2 via NASAL
  Filled 2022-05-29: qty 21.6

## 2022-05-29 MED ORDER — CEFAZOLIN SODIUM-DEXTROSE 2-4 GM/100ML-% IV SOLN
2.0000 g | Freq: Four times a day (QID) | INTRAVENOUS | Status: AC
  Administered 2022-05-29 (×2): 2 g via INTRAVENOUS
  Filled 2022-05-29 (×2): qty 100

## 2022-05-29 MED ORDER — ALBUTEROL SULFATE (2.5 MG/3ML) 0.083% IN NEBU: 2.5000 mg | INHALATION_SOLUTION | Freq: Four times a day (QID) | RESPIRATORY_TRACT | Status: DC | PRN

## 2022-05-29 MED ORDER — METHOCARBAMOL 1000 MG/10ML IJ SOLN: 500.0000 mg | Freq: Four times a day (QID) | INTRAVENOUS | Status: DC | PRN

## 2022-05-29 MED ORDER — MIDAZOLAM HCL 2 MG/2ML IJ SOLN
INTRAMUSCULAR | Status: AC
  Filled 2022-05-29: qty 2

## 2022-05-29 MED ORDER — BISACODYL 10 MG RE SUPP: 10.0000 mg | Freq: Every day | RECTAL | Status: DC | PRN

## 2022-05-29 MED ORDER — BUPIVACAINE-EPINEPHRINE (PF) 0.25% -1:200000 IJ SOLN
INTRAMUSCULAR | Status: AC
  Filled 2022-05-29: qty 30

## 2022-05-29 MED ORDER — DEXMEDETOMIDINE HCL IN NACL 80 MCG/20ML IV SOLN
INTRAVENOUS | Status: AC
  Filled 2022-05-29: qty 20

## 2022-05-29 MED ORDER — POVIDONE-IODINE 10 % EX SWAB
2.0000 | Freq: Once | CUTANEOUS | Status: AC
  Administered 2022-05-29: 2 via TOPICAL

## 2022-05-29 MED ORDER — DEXAMETHASONE SODIUM PHOSPHATE 10 MG/ML IJ SOLN
INTRAMUSCULAR | Status: DC | PRN
  Administered 2022-05-29: 10 mg via INTRAVENOUS

## 2022-05-29 MED ORDER — LISINOPRIL-HYDROCHLOROTHIAZIDE 20-25 MG PO TABS: 1.0000 | ORAL_TABLET | Freq: Every day | ORAL | Status: DC

## 2022-05-29 MED ORDER — PROMETHAZINE HCL 25 MG/ML IJ SOLN: 6.2500 mg | INTRAMUSCULAR | Status: DC | PRN

## 2022-05-29 MED ORDER — LIDOCAINE 2% (20 MG/ML) 5 ML SYRINGE
INTRAMUSCULAR | Status: DC | PRN
  Administered 2022-05-29: 60 mg via INTRAVENOUS

## 2022-05-29 SURGICAL SUPPLY — 53 items
ATTUNE MED ANAT PAT 32 KNEE (Knees) IMPLANT
ATTUNE PSFEM LTSZ5 NARCEM KNEE (Femur) IMPLANT
ATTUNE PSRP INSR SZ5 5 KNEE (Insert) IMPLANT
BAG COUNTER SPONGE SURGICOUNT (BAG) IMPLANT
BAG ZIPLOCK 12X15 (MISCELLANEOUS) IMPLANT
BASEPLATE TIBIAL ROTATING SZ 4 (Knees) IMPLANT
BLADE SAW SGTL 11.0X1.19X90.0M (BLADE) IMPLANT
BLADE SAW SGTL 13.0X1.19X90.0M (BLADE) ×1 IMPLANT
BNDG ELASTIC 6X5.8 VLCR STR LF (GAUZE/BANDAGES/DRESSINGS) ×1 IMPLANT
BOWL SMART MIX CTS (DISPOSABLE) ×1 IMPLANT
CEMENT HV SMART SET (Cement) IMPLANT
CUFF TOURN SGL QUICK 34 (TOURNIQUET CUFF) ×1
CUFF TRNQT CYL 34X4.125X (TOURNIQUET CUFF) ×1 IMPLANT
DERMABOND ADVANCED (GAUZE/BANDAGES/DRESSINGS) ×1
DERMABOND ADVANCED .7 DNX12 (GAUZE/BANDAGES/DRESSINGS) ×1 IMPLANT
DRAPE SHEET LG 3/4 BI-LAMINATE (DRAPES) ×1 IMPLANT
DRAPE U-SHAPE 47X51 STRL (DRAPES) ×1 IMPLANT
DRESSING AQUACEL AG SP 3.5X10 (GAUZE/BANDAGES/DRESSINGS) ×1 IMPLANT
DRSG AQUACEL AG ADV 3.5X10 (GAUZE/BANDAGES/DRESSINGS) IMPLANT
DRSG AQUACEL AG SP 3.5X10 (GAUZE/BANDAGES/DRESSINGS) ×1
DURAPREP 26ML APPLICATOR (WOUND CARE) ×2 IMPLANT
ELECT REM PT RETURN 15FT ADLT (MISCELLANEOUS) ×1 IMPLANT
GLOVE BIO SURGEON STRL SZ 6 (GLOVE) ×1 IMPLANT
GLOVE BIOGEL PI IND STRL 6.5 (GLOVE) ×1 IMPLANT
GLOVE BIOGEL PI IND STRL 7.5 (GLOVE) ×1 IMPLANT
GLOVE BIOGEL PI INDICATOR 6.5 (GLOVE) ×1
GLOVE BIOGEL PI INDICATOR 7.5 (GLOVE) ×1
GLOVE ORTHO TXT STRL SZ7.5 (GLOVE) ×2 IMPLANT
GOWN STRL REUS W/ TWL LRG LVL3 (GOWN DISPOSABLE) ×2 IMPLANT
GOWN STRL REUS W/TWL LRG LVL3 (GOWN DISPOSABLE) ×2
HANDPIECE INTERPULSE COAX TIP (DISPOSABLE) ×1
HOLDER FOLEY CATH W/STRAP (MISCELLANEOUS) IMPLANT
KIT TURNOVER KIT A (KITS) IMPLANT
MANIFOLD NEPTUNE II (INSTRUMENTS) ×1 IMPLANT
NDL SAFETY ECLIP 18X1.5 (MISCELLANEOUS) IMPLANT
NS IRRIG 1000ML POUR BTL (IV SOLUTION) ×1 IMPLANT
PACK TOTAL KNEE CUSTOM (KITS) ×1 IMPLANT
PROTECTOR NERVE ULNAR (MISCELLANEOUS) ×1 IMPLANT
SET HNDPC FAN SPRY TIP SCT (DISPOSABLE) ×1 IMPLANT
SET PAD KNEE POSITIONER (MISCELLANEOUS) ×1 IMPLANT
SPIKE FLUID TRANSFER (MISCELLANEOUS) ×2 IMPLANT
SUT MNCRL AB 4-0 PS2 18 (SUTURE) ×1 IMPLANT
SUT STRATAFIX PDS+ 0 24IN (SUTURE) ×1 IMPLANT
SUT VIC AB 1 CT1 36 (SUTURE) ×1 IMPLANT
SUT VIC AB 2-0 CT1 27 (SUTURE) ×2
SUT VIC AB 2-0 CT1 TAPERPNT 27 (SUTURE) ×2 IMPLANT
SYR 3ML LL SCALE MARK (SYRINGE) ×1 IMPLANT
TOWEL GREEN STERILE FF (TOWEL DISPOSABLE) ×1 IMPLANT
TRAY CATH INTERMITTENT SS 16FR (CATHETERS) ×1 IMPLANT
TRAY FOLEY MTR SLVR 16FR STAT (SET/KITS/TRAYS/PACK) ×1 IMPLANT
TUBE SUCTION HIGH CAP CLEAR NV (SUCTIONS) ×1 IMPLANT
WATER STERILE IRR 1000ML POUR (IV SOLUTION) ×2 IMPLANT
WRAP KNEE MAXI GEL POST OP (GAUZE/BANDAGES/DRESSINGS) ×1 IMPLANT

## 2022-05-29 NOTE — Anesthesia Procedure Notes (Signed)
Spinal  Start time: 05/29/2022 7:22 AM End time: 05/29/2022 7:24 AM Reason for block: surgical anesthesia Staffing Performed: anesthesiologist  Anesthesiologist: Shelton Silvas, MD Performed by: Shelton Silvas, MD Authorized by: Shelton Silvas, MD   Preanesthetic Checklist Completed: patient identified, IV checked, site marked, risks and benefits discussed, surgical consent, monitors and equipment checked, pre-op evaluation and timeout performed Spinal Block Patient position: sitting Prep: DuraPrep and site prepped and draped Location: L3-4 Injection technique: single-shot Needle Needle type: Pencan  Needle gauge: 24 G Needle length: 10 cm Needle insertion depth: 10 cm Assessment Events: CSF return and second provider Additional Notes Patient tolerated well. No immediate complications.  Functioning IV was confirmed and monitors were applied. Sterile prep and drape, including hand hygiene and sterile gloves were used. The patient was positioned and the back was prepped. The skin was anesthetized with lidocaine. Free flow of clear CSF was obtained prior to injecting local anesthetic into the CSF. The spinal needle aspirated freely following injection. The needle was carefully withdrawn. The patient tolerated the procedure well.   CRNA x1, MDA x1

## 2022-05-29 NOTE — Interval H&P Note (Signed)
History and Physical Interval Note:  05/29/2022 7:07 AM  [name]  has presented today for surgery, with the diagnosis of Left knee osteoarthritis.  The various methods of treatment have been discussed with the patient and family. After consideration of risks, benefits and other options for treatment, the patient has consented to  Procedure(s): TOTAL KNEE ARTHROPLASTY (Left) as a surgical intervention.  The patient's history has been reviewed, patient examined, no change in status, stable for surgery.  I have reviewed the patient's chart and labs.  Questions were answered to the patient's satisfaction.     Meagan Walls

## 2022-05-29 NOTE — Evaluation (Signed)
Physical Therapy Evaluation Patient Details Name: Meagan Walls MRN: [id number] DOB: [birth year] Today's Date: 05/29/2022  History of Present Illness  Pt is a 66yo female presenting s/p L-TKA on 05/29/22. PMH: CKD, GERD, HTN, pre-diabetes.  Clinical Impression  Meagan Walls is a 66 y.o. female POD 0 s/p L-TKA. Patient reports modified independence using SPC for mobility at baseline. Patient is now limited by functional impairments (see PT problem list below) and requires supervision for bed mobility and min guard for transfers. Patient was able to ambulate 20 feet with RW and min guard level of assist. Patient instructed in exercise to facilitate ROM and circulation to manage edema. Provided incentive spirometer and with Vcs pt able to achieve . Patient will benefit from continued skilled PT interventions to address impairments and progress towards PLOF. Acute PT will follow to progress mobility and stair training in preparation for safe discharge home.       Recommendations for follow up therapy are one component of a multi-disciplinary discharge planning process, led by the attending physician.  Recommendations may be updated based on patient status, additional functional criteria and insurance authorization.  Follow Up Recommendations Follow physician's recommendations for discharge plan and follow up therapies      Assistance Recommended at Discharge Intermittent Supervision/Assistance  Patient can return home with the following  A little help with walking and/or transfers;A little help with bathing/dressing/bathroom;Assistance with cooking/housework;Assist for transportation;Help with stairs or ramp for entrance    Equipment Recommendations Rolling walker (2 wheels)  Recommendations for Other Services       Functional Status Assessment Patient has had a recent decline in their functional status and demonstrates the ability to make significant improvements in function in a  reasonable and predictable amount of time.     Precautions / Restrictions Precautions Precautions: Fall Restrictions Weight Bearing Restrictions: No      Mobility  Bed Mobility Overal bed mobility: Needs Assistance Bed Mobility: Supine to Sit     Supine to sit: Supervision     General bed mobility comments: For safety only, increased time.    Transfers Overall transfer level: Needs assistance Equipment used: Rolling walker (2 wheels) Transfers: Sit to/from Stand Sit to Stand: Min guard           General transfer comment: For safety only, multimodal cues for sequencing and powering up    Ambulation/Gait Ambulation/Gait assistance: Min guard, +2 safety/equipment Gait Distance (Feet): 20 Feet Assistive device: Rolling walker (2 wheels) Gait Pattern/deviations: Step-to pattern Gait velocity: decreased     General Gait Details: Pt ambulated 20ft with RW and min guard, no physical assist or overt LOB noted, +2 for recliner follow only.  Stairs            Wheelchair Mobility    Modified Rankin (Stroke Patients Only)       Balance Overall balance assessment: Needs assistance Sitting-balance support: Feet supported, No upper extremity supported Sitting balance-Leahy Scale: Good     Standing balance support: Reliant on assistive device for balance, During functional activity, Bilateral upper extremity supported Standing balance-Leahy Scale: Poor                               Pertinent Vitals/Pain Pain Assessment Pain Assessment: 0-10 Pain Score: 2  Pain Location: L knee Pain Descriptors / Indicators: Operative site guarding, Burning Pain Intervention(s): Limited activity within patient's tolerance, Monitored during session, Repositioned, Ice applied    Home Living Family/patient  expects to be discharged to:: Private residence Living Arrangements: Spouse/significant other Available Help at Discharge: Family;Available 24 hours/day Type  of Home: House Home Access: Stairs to enter Entrance Stairs-Rails: Left Entrance Stairs-Number of Steps: 5   Home Layout: One level Home Equipment: Shower seat - built in;Toilet riser;Cane - single point      Prior Function Prior Level of Function : Independent/Modified Independent             Mobility Comments: SPC for yard mobility ADLs Comments: ind     Hand Dominance        Extremity/Trunk Assessment   Upper Extremity Assessment Upper Extremity Assessment: Overall WFL for tasks assessed    Lower Extremity Assessment Lower Extremity Assessment: RLE deficits/detail;LLE deficits/detail RLE Deficits / Details: MMT ank DF/PF 5/5/ RLE Sensation: WNL LLE Deficits / Details: MMT ank DF/PF 5/5/, no extensor lag noted LLE Sensation: WNL    Cervical / Trunk Assessment Cervical / Trunk Assessment: Normal  Communication   Communication: No difficulties  Cognition Arousal/Alertness: Awake/alert Behavior During Therapy: WFL for tasks assessed/performed Overall Cognitive Status: Within Functional Limits for tasks assessed                                          General Comments General comments (skin integrity, edema, etc.): Husband Mack Present    Exercises Total Joint Exercises Ankle Circles/Pumps: AROM, Both, 10 reps   Assessment/Plan    PT Assessment Patient needs continued PT services  PT Problem List Decreased strength;Decreased range of motion;Decreased activity tolerance;Decreased balance;Decreased mobility;Decreased coordination;Pain       PT Treatment Interventions DME instruction;Gait training;Stair training;Functional mobility training;Therapeutic activities;Therapeutic exercise;Balance training;Neuromuscular re-education;Patient/family education    PT Goals (Current goals can be found in the Care Plan section)  Acute Rehab PT Goals Patient Stated Goal: to walk without pain PT Goal Formulation: With patient Time For Goal  Achievement: 06/05/22 Potential to Achieve Goals: Good    Frequency 7X/week     Co-evaluation               AM-PAC PT "6 Clicks" Mobility  Outcome Measure Help needed turning from your back to your side while in a flat bed without using bedrails?: None Help needed moving from lying on your back to sitting on the side of a flat bed without using bedrails?: A Little Help needed moving to and from a bed to a chair (including a wheelchair)?: A Little Help needed standing up from a chair using your arms (e.g., wheelchair or bedside chair)?: A Little Help needed to walk in hospital room?: A Little Help needed climbing 3-5 steps with a railing? : A Little 6 Click Score: 19    End of Session Equipment Utilized During Treatment: Gait belt Activity Tolerance: Patient tolerated treatment well;No increased pain Patient left: in chair;with call bell/phone within reach;with chair alarm set;with family/visitor present;with SCD's reapplied Nurse Communication: Mobility status PT Visit Diagnosis: Pain;Difficulty in walking, not elsewhere classified (R26.2) Pain - Right/Left: Left Pain - part of body: Knee    Time: 1455-1526 PT Time Calculation (min) (ACUTE ONLY): 31 min   Charges:   PT Evaluation $PT Eval Low Complexity: 1 Low PT Treatments $Gait Training: 8-22 mins        Jamesetta Geralds, PT, DPT [facility] Rehabilitation Department Office: [phone] Pager: [phone]  Jamesetta Geralds 05/29/2022, 4:43 PM

## 2022-05-29 NOTE — Discharge Instructions (Signed)

## 2022-05-29 NOTE — Op Note (Signed)
NAME:  Meagan Walls                      MEDICAL RECORD NO.:  [id number]                             FACILITY:  [facility]      PHYSICIAN:  Madlyn Frankel. Charlann Boxer, M.D.  DATE OF BIRTH:  1956-02-04      DATE OF PROCEDURE:  05/29/2022                                     OPERATIVE REPORT         PREOPERATIVE DIAGNOSIS:  Left knee osteoarthritis.      POSTOPERATIVE DIAGNOSIS:  Left knee osteoarthritis.      FINDINGS:  The patient was noted to have complete loss of cartilage and   bone-on-bone arthritis with associated osteophytes in the medial and patellofemoral compartments of   the knee with a significant synovitis and associated effusion.  The patient had failed months of conservative treatment including medications, injection therapy, activity modification.     PROCEDURE:  Left total knee replacement.      COMPONENTS USED:  DePuy Attune rotating platform posterior stabilized knee   system, a size 5N femur, 4 tibia, size 5 mm PS AOX insert, and 32 anatomic patellar   button.      SURGEON:  Madlyn Frankel. Charlann Boxer, M.D.      ASSISTANT:  Rosalene Billings, PA-C.      ANESTHESIA:  Regional and Spinal.      SPECIMENS:  None.      COMPLICATION:  None.      DRAINS:  None.  EBL: <200 cc      TOURNIQUET TIME:  32 min at 225 mmHg     The patient was stable to the recovery room.      INDICATION FOR PROCEDURE:  Meagan Walls is a 66 y.o. female patient of   mine.  The patient had been seen, evaluated, and treated for months conservatively in the   office with medication, activity modification, and injections.  The patient had   radiographic changes of bone-on-bone arthritis with endplate sclerosis and osteophytes noted.  Based on the radiographic changes and failed conservative measures, the patient   decided to proceed with definitive treatment, total knee replacement.  Risks of infection, DVT, component failure, need for revision surgery, neurovascular injury were reviewed in the office setting.   The postop course was reviewed stressing the efforts to maximize post-operative satisfaction and function.  Consent was obtained for benefit of pain   relief.      PROCEDURE IN DETAIL:  The patient was brought to the operative theater.   Once adequate anesthesia, preoperative antibiotics, 2 gm of Ancef,1 gm of Tranexamic Acid, and 10 mg of Decadron administered, the patient was positioned supine with a left thigh tourniquet placed.  The  left lower extremity was prepped and draped in sterile fashion.  A time-   out was performed identifying the patient, planned procedure, and the appropriate extremity.      The left lower extremity was placed in the [facility] leg holder.  The leg was   exsanguinated, tourniquet elevated to 250 mmHg.  A midline incision was   made followed by median parapatellar arthrotomy.  Following initial   exposure, attention was first directed  to the patella.  Precut   measurement was noted to be 22 mm.  I resected down to 14 mm and used a   32 anatomic patellar button to restore patellar height as well as cover the cut surface.      The lug holes were drilled and a metal shim was placed to protect the   patella from retractors and saw blade during the procedure.      At this point, attention was now directed to the femur.  The femoral   canal was opened with a drill, irrigated to try to prevent fat emboli.  An   intramedullary rod was passed at 3 degrees valgus, 9 mm of bone was   resected off the distal femur.  Following this resection, the tibia was   subluxated anteriorly.  Using the extramedullary guide, 2 mm of bone was resected off   the proximal medial tibia.  We confirmed the gap would be   stable medially and laterally with a size 5 spacer block as well as confirmed that the tibial cut was perpendicular in the coronal plane, checking with an alignment rod.      Once this was done, I sized the femur to be a size 5 in the anterior-   posterior dimension, chose a  narrow component based on medial and   lateral dimension.  The size 5 rotation block was then pinned in   position anterior referenced using the C-clamp to set rotation.  The   anterior, posterior, and  chamfer cuts were made without difficulty nor   notching making certain that I was along the anterior cortex to help   with flexion gap stability.      The final box cut was made off the lateral aspect of distal femur.      At this point, the tibia was sized to be a size 4.  The size 4 tray was   then pinned in position through the medial third of the tubercle,   drilled, and keel punched.  Trial reduction was now carried with a 5 femur,  4 tibia, a size 5 mm PS insert, and the 32 anatomic patella botton.  The knee was brought to full extension with good flexion stability with the patella   tracking through the trochlea without application of pressure.  Given   all these findings the trial components removed.  Final components were   opened and cement was mixed.  The knee was irrigated with normal saline solution and pulse lavage.  The synovial lining was   then injected with 30 cc of 0.25% Marcaine with epinephrine, 1 cc of Toradol and 30 cc of NS for a total of 61 cc.     Final implants were then cemented onto cleaned and dried cut surfaces of bone with the knee brought to extension with a size 5 mm PS trial insert.      Once the cement had fully cured, excess cement was removed   throughout the knee.  I confirmed that I was satisfied with the range of   motion and stability, and the final size 5 mm PS AOX insert was chosen.  It was   placed into the knee.      The tourniquet had been let down at 32 minutes.  No significant   hemostasis was required.  The extensor mechanism was then reapproximated using #1 Vicryl and #1 Stratafix sutures with the knee   in flexion.  The   remaining  wound was closed with 2-0 Vicryl and running 4-0 Monocryl.   The knee was cleaned, dried, dressed  sterilely using Dermabond and   Aquacel dressing.  The patient was then   brought to recovery room in stable condition, tolerating the procedure   well.   Please note that Physician Assistant, Rosalene Billings, PA-C was present for the entirety of the case, and was utilized for pre-operative positioning, peri-operative retractor management, general facilitation of the procedure and for primary wound closure at the end of the case.              Madlyn Frankel Charlann Boxer, M.D.    05/29/2022 7:24 AM

## 2022-05-29 NOTE — Plan of Care (Signed)
  Problem: Pain Management: Goal: Pain level will decrease with appropriate interventions 05/29/2022 2254 by Kizzie Bane, RN Outcome: Progressing 05/29/2022 2254 by Kizzie Bane, RN Outcome: Progressing   Problem: Coping: Goal: Level of anxiety will decrease 05/29/2022 2254 by Kizzie Bane, RN Outcome: Progressing 05/29/2022 2254 by Kizzie Bane, RN Outcome: Progressing

## 2022-05-29 NOTE — Anesthesia Postprocedure Evaluation (Signed)
Anesthesia Post Note  Patient: Ahriana Gunkel  Procedure(s) Performed: TOTAL KNEE ARTHROPLASTY (Left: Knee)     Patient location during evaluation: PACU Anesthesia Type: Spinal Level of consciousness: oriented and awake and alert Pain management: pain level controlled Vital Signs Assessment: post-procedure vital signs reviewed and stable Respiratory status: spontaneous breathing, respiratory function stable and patient connected to nasal cannula oxygen Cardiovascular status: blood pressure returned to baseline and stable Postop Assessment: no headache, no backache and no apparent nausea or vomiting Anesthetic complications: no   No notable events documented.  Last Vitals:  Vitals:   05/29/22 1200 05/29/22 1426  BP: (!) 141/105 (!) 171/88  Pulse: 74 93  Resp: 13 16  Temp:    SpO2: 100% 94%    Last Pain:  Vitals:   05/29/22 1517  TempSrc:   PainSc: 2                  Shelton Silvas

## 2022-05-29 NOTE — Transfer of Care (Signed)
Immediate Anesthesia Transfer of Care Note  Patient: Meagan Walls  Procedure(s) Performed: Procedure(s): TOTAL KNEE ARTHROPLASTY (Left)  Patient Location: PACU  Anesthesia Type:Spinal  Level of Consciousness: awake, alert  and oriented  Airway & Oxygen Therapy: Patient Spontanous Breathing  Post-op Assessment: Report given to RN and Post -op Vital signs reviewed and stable  Post vital signs: Reviewed and stable  Last Vitals:  Vitals:   05/29/22 0555 05/29/22 0611  BP: (!) 189/92 (!) 165/95  Pulse: 93   Resp: 15   Temp: 36.8 C   SpO2: 100%     Complications: No apparent anesthesia complications

## 2022-05-30 ENCOUNTER — Encounter (HOSPITAL_COMMUNITY): Payer: Self-pay | Admitting: Orthopedic Surgery

## 2022-05-30 DIAGNOSIS — M1712 Unilateral primary osteoarthritis, left knee: Secondary | ICD-10-CM | POA: Diagnosis not present

## 2022-05-30 LAB — BASIC METABOLIC PANEL
Anion gap: 7 (ref 5–15)
BUN: 21 mg/dL (ref 8–23)
CO2: 22 mmol/L (ref 22–32)
Calcium: 8.2 mg/dL — ABNORMAL LOW (ref 8.9–10.3)
Chloride: 101 mmol/L (ref 98–111)
Creatinine, Ser: 0.79 mg/dL (ref 0.44–1.00)
GFR, Estimated: >60 mL/min (ref >60)
Glucose, Bld: 178 mg/dL — ABNORMAL HIGH (ref 70–99)
Potassium: 4 mmol/L (ref 3.5–5.1)
Sodium: 130 mmol/L — ABNORMAL LOW (ref 135–145)

## 2022-05-30 LAB — CBC
HCT: 32.4 % — ABNORMAL LOW (ref 36.0–46.0)
Hemoglobin: 11.1 g/dL — ABNORMAL LOW (ref 12.0–15.0)
MCH: 31.3 pg (ref 26.0–34.0)
MCHC: 34.3 g/dL (ref 30.0–36.0)
MCV: 91.3 fL (ref 80.0–100.0)
Platelets: 251 10*3/uL (ref 150–400)
RBC: 3.55 MIL/uL — ABNORMAL LOW (ref 3.87–5.11)
RDW: 12 % (ref 11.5–15.5)
WBC: 15.1 10*3/uL — ABNORMAL HIGH (ref 4.0–10.5)
nRBC: 0 % (ref 0.0–0.2)

## 2022-05-30 MED ORDER — ASPIRIN 81 MG PO CHEW: 81.0000 mg | CHEWABLE_TABLET | Freq: Two times a day (BID) | ORAL | 0 refills | Status: AC

## 2022-05-30 MED ORDER — OXYCODONE HCL 5 MG PO TABS: 5.0000 mg | ORAL_TABLET | ORAL | 0 refills | Status: DC | PRN

## 2022-05-30 MED ORDER — METHOCARBAMOL 500 MG PO TABS
500.0000 mg | ORAL_TABLET | Freq: Four times a day (QID) | ORAL | 0 refills | Status: DC | PRN
Start: 2022-05-30 — End: 2022-11-14

## 2022-05-30 MED ORDER — POLYETHYLENE GLYCOL 3350 17 G PO PACK
17.0000 g | PACK | Freq: Every day | ORAL | 0 refills | Status: DC | PRN
Start: 2022-05-30 — End: 2022-11-14

## 2022-05-30 MED ORDER — ONDANSETRON HCL 4 MG PO TABS: 4.0000 mg | ORAL_TABLET | Freq: Four times a day (QID) | ORAL | 0 refills | Status: DC | PRN

## 2022-05-30 MED ORDER — DOCUSATE SODIUM 100 MG PO CAPS: 100.0000 mg | ORAL_CAPSULE | Freq: Two times a day (BID) | ORAL | 0 refills | Status: DC

## 2022-05-30 MED ORDER — ACETAMINOPHEN 500 MG PO TABS: 1000.0000 mg | ORAL_TABLET | Freq: Four times a day (QID) | ORAL | 0 refills | Status: AC

## 2022-05-30 NOTE — Progress Notes (Signed)
Patient sleeping, turning on right side frequently to reposition self, sleeping soundly at the moment, will continue to monitor.

## 2022-05-30 NOTE — Progress Notes (Signed)
   Subjective: 1 Day Post-Op Procedure(s) (LRB): TOTAL KNEE ARTHROPLASTY (Left) Patient reports pain as mild.   Patient seen in rounds with Dr. Charlann Boxer. Patient is well, and has had no acute complaints or problems. No acute events overnight. Foley catheter removed. Patient ambulated 20 feet with PT. Patient reports [facility] has not reached out to her yet, we will resend order to PT.  We will continue therapy today.   Objective: Vital signs in last 24 hours: Temp:  [97.7 F (36.5 C)-98.1 F (36.7 C)] 98.1 F (36.7 C) (08/30 0554) Pulse Rate:  [65-93] 71 (08/30 0554) Resp:  [11-19] 17 (08/30 0554) BP: (118-171)/(68-105) 128/69 (08/30 0554) SpO2:  [90 %-100 %] 94 % (08/30 0554) Weight:  [619 kg] 113 kg (08/29 1200)  Intake/Output from previous day:  Intake/Output Summary (Last 24 hours) at 05/30/2022 0809 Last data filed at 05/30/2022 0600 Gross per 24 hour  Intake 2980.99 ml  Output 1630 ml  Net 1350.99 ml     Intake/Output this shift: No intake/output data recorded.  Labs: Recent Labs    05/30/22 0328  HGB 11.1*   Recent Labs    05/30/22 0328  WBC 15.1*  RBC 3.55*  HCT 32.4*  PLT 251   Recent Labs    05/30/22 0328  NA 130*  K 4.0  CL 101  CO2 22  BUN 21  CREATININE 0.79  GLUCOSE 178*  CALCIUM 8.2*   No results for input(s): "LABPT", "INR" in the last 72 hours.  Exam: General - Patient is Alert and Oriented Extremity - Neurologically intact Sensation intact distally Intact pulses distally Dorsiflexion/Plantar flexion intact Dressing - dressing C/D/I Motor Function - intact, moving foot and toes well on exam.   Past Medical History:  Diagnosis Date   Arthritis    Chronic kidney disease    GERD (gastroesophageal reflux disease)    History of kidney stones    Hypertension    Pneumonia    PONV (postoperative nausea and vomiting)    Pre-diabetes     Assessment/Plan: 1 Day Post-Op Procedure(s) (LRB): TOTAL KNEE ARTHROPLASTY (Left) Principal  Problem:   S/P total knee arthroplasty, left  Estimated body mass index is 44.13 kg/m as calculated from the following:   Height as of this encounter: 5\' 3"  (1.6 m).   Weight as of this encounter: 113 kg. Advance diet Up with therapy D/C IV fluids   Patient's anticipated LOS is less than 2 midnights, meeting these requirements: - Younger than 1 - Lives within 1 hour of care - Has a competent adult at home to recover with post-op recover - NO history of  - Chronic pain requiring opiods  - Diabetes  - Coronary Artery Disease  - Heart failure  - Heart attack  - Stroke  - DVT/VTE  - Cardiac arrhythmia  - Respiratory Failure/COPD  - Renal failure  - Anemia  - Advanced Liver disease     DVT Prophylaxis - Aspirin Weight bearing as tolerated.  Hgb stable at 11.1 this AM.  Plan is to go Home after hospital stay. Plan for discharge today following 1-2 sessions of PT as long as they are meeting their goals. PT order sent to [facility] in [place] today - goal of 1st visit on Friday. Follow up in the office in 2 weeks.   Friday, PA-C Orthopedic Surgery [phone] 05/30/2022, 8:09 AM

## 2022-05-30 NOTE — Progress Notes (Signed)
Discharge instructions given to patient. Patient verbalizes understanding.

## 2022-05-30 NOTE — TOC Transition Note (Signed)
Transition of Care [facility]) - CM/SW Discharge Note  Patient Details  Name: Meagan Walls MRN: [id number] Date of Birth: [birth year]  [facility] CM/SW Contact:  Sherie Don, LCSW Phone Number: 05/30/2022, 10:52 AM  Clinical Narrative: Patient is expected to discharge home after working with PT. CSW met with patient and spouse regarding discharge plan and needs. Patient will go home with OPPT at [facility] in [place]. Patient will need a rolling walker, which was delivered to the room by [employer]. TOC signing off.    Final next level of care: OP Rehab Barriers to Discharge: No Barriers Identified  Patient Goals and CMS Choice Patient states their goals for this hospitalization and ongoing recovery are:: Discharge home with OPPT at [facility] in [email or web address] Compare Post Acute Care list provided to:: Patient Choice offered to / list presented to : Patient  Discharge Plan and Services      DME Arranged: Walker rolling DME Agency: [employer] Date DME Agency Contacted: 05/30/22 Representative spoke with at DME Agency: Wells Guiles  Readmission Risk Interventions     No data to display

## 2022-05-30 NOTE — Progress Notes (Signed)
Physical Therapy Treatment Patient Details Name: Meagan Walls MRN: [id number] DOB: Oct 15, 1955 Today's Date: 05/30/2022   History of Present Illness Pt is a 66yo female presenting s/p L-TKA on 05/29/22. PMH: CKD, GERD, HTN, pre-diabetes.    PT Comments    Pt ambulated 64' with RW, competed stair training with spouse present, and demonstrates good understanding of HEP. She is ready to DC home from a PT standpoint.    Recommendations for follow up therapy are one component of a multi-disciplinary discharge planning process, led by the attending physician.  Recommendations may be updated based on patient status, additional functional criteria and insurance authorization.  Follow Up Recommendations  Follow physician's recommendations for discharge plan and follow up therapies     Assistance Recommended at Discharge Intermittent Supervision/Assistance  Patient can return home with the following A little help with walking and/or transfers;A little help with bathing/dressing/bathroom;Assistance with cooking/housework;Assist for transportation;Help with stairs or ramp for entrance   Equipment Recommendations  Rolling walker (2 wheels)    Recommendations for Other Services       Precautions / Restrictions Precautions Precautions: Fall;Knee Precaution Booklet Issued: Yes (comment) Precaution Comments: reviewed no pillow under knee Restrictions Weight Bearing Restrictions: No LLE Weight Bearing: Weight bearing as tolerated     Mobility  Bed Mobility Overal bed mobility: Modified Independent Bed Mobility: Supine to Sit     Supine to sit: Modified independent (Device/Increase time), HOB elevated     General bed mobility comments: HOB up, used bedrail    Transfers Overall transfer level: Needs assistance Equipment used: Rolling walker (2 wheels) Transfers: Sit to/from Stand Sit to Stand: Min guard           General transfer comment: For safety only, multimodal cues for  sequencing and hand placement    Ambulation/Gait Ambulation/Gait assistance: Supervision Gait Distance (Feet): 80 Feet Assistive device: Rolling walker (2 wheels) Gait Pattern/deviations: Step-to pattern Gait velocity: decreased     General Gait Details: VCs sequencing, no loss of balance   Stairs Stairs: Yes Stairs assistance: Min assist Stair Management: Backwards, With walker, Step to pattern Number of Stairs: 3 General stair comments: spouse present and assisted with stabilizing RW, VCs sequencing; initially attempted sideways but pt felt unsteady so switched to backwards which felt more comfortable for pt   Wheelchair Mobility    Modified Rankin (Stroke Patients Only)       Balance Overall balance assessment: Needs assistance Sitting-balance support: Feet supported, No upper extremity supported Sitting balance-Leahy Scale: Good     Standing balance support: Reliant on assistive device for balance, During functional activity, Bilateral upper extremity supported Standing balance-Leahy Scale: Poor                              Cognition Arousal/Alertness: Awake/alert Behavior During Therapy: WFL for tasks assessed/performed Overall Cognitive Status: Within Functional Limits for tasks assessed                                          Exercises Total Joint Exercises Ankle Circles/Pumps: AROM, Both, 10 reps, Supine Quad Sets: AROM, Left, 5 reps, Supine Short Arc Quad: AAROM, Left, 5 reps, Supine Heel Slides: AAROM, Left, 5 reps, Supine Hip ABduction/ADduction: AAROM, Left, 5 reps, Supine Straight Leg Raises: AAROM, Left, 5 reps, Supine Long Arc Quad: AROM, Left, 5 reps, Seated Knee Flexion:  AAROM, Left, 10 reps, Seated Goniometric ROM: 5-60* AAROM L knee    General Comments        Pertinent Vitals/Pain Pain Assessment Pain Score: 5  Pain Location: L knee Pain Descriptors / Indicators: Operative site guarding, Burning Pain  Intervention(s): Limited activity within patient's tolerance, Monitored during session, Premedicated before session, Ice applied    Home Living                          Prior Function            PT Goals (current goals can now be found in the care plan section) Acute Rehab PT Goals Patient Stated Goal: to walk without pain PT Goal Formulation: With patient Time For Goal Achievement: 06/05/22 Potential to Achieve Goals: Good Progress towards PT goals: Progressing toward goals    Frequency    7X/week      PT Plan Current plan remains appropriate    Co-evaluation              AM-PAC PT "6 Clicks" Mobility   Outcome Measure  Help needed turning from your back to your side while in a flat bed without using bedrails?: None Help needed moving from lying on your back to sitting on the side of a flat bed without using bedrails?: A Little Help needed moving to and from a bed to a chair (including a wheelchair)?: A Little Help needed standing up from a chair using your arms (e.g., wheelchair or bedside chair)?: A Little Help needed to walk in hospital room?: A Little Help needed climbing 3-5 steps with a railing? : A Little 6 Click Score: 19    End of Session Equipment Utilized During Treatment: Gait belt Activity Tolerance: Patient tolerated treatment well Patient left: in chair;with call bell/phone within reach;with chair alarm set;with family/visitor present Nurse Communication: Mobility status PT Visit Diagnosis: Pain;Difficulty in walking, not elsewhere classified (R26.2) Pain - Right/Left: Left Pain - part of body: Knee     Time: 0160-1093 PT Time Calculation (min) (ACUTE ONLY): 47 min  Charges:  $Gait Training: 8-22 mins $Therapeutic Exercise: 8-22 mins $Therapeutic Activity: 8-22 mins                    Ralene Bathe Kistler PT 05/30/2022  Acute Rehabilitation Services  Office [phone]

## 2022-05-30 NOTE — Plan of Care (Signed)
  Problem: Pain Management: Goal: Pain level will decrease with appropriate interventions Outcome: Progressing   

## 2022-06-01 NOTE — Anesthesia Procedure Notes (Signed)
Anesthesia Regional Block: Adductor canal block   Pre-Anesthetic Checklist: , timeout performed,  Correct Patient, Correct Site, Correct Laterality,  Correct Procedure, Correct Position, site marked,  Risks and benefits discussed,  Surgical consent,  Pre-op evaluation,  At surgeon's request and post-op pain management  Laterality: Left  Prep: chloraprep       Needles:  Injection technique: Single-shot  Needle Type: Echogenic Stimulator Needle     Needle Length: 9cm  Needle Gauge: 21     Additional Needles:   Procedures:,,,, ultrasound used (permanent image in chart),,    Narrative:  Start time: 06/01/2022 6:45 AM End time: 06/01/2022 6:50 AM Injection made incrementally with aspirations every 5 mL.  Performed by: Personally  Anesthesiologist: Shelton Silvas, MD  Additional Notes: Patient tolerated the procedure well. Local anesthetic introduced in an incremental fashion under minimal resistance after negative aspirations. No paresthesias were elicited. After completion of the procedure, no acute issues were identified and patient continued to be monitored by RN.

## 2022-06-01 NOTE — Addendum Note (Signed)
Addendum  created 06/01/22 1831 by Shelton Silvas, MD   Child order released for a procedure order, Clinical Note Signed, Intraprocedure Blocks edited, Order Canceled from Note, SmartForm saved

## 2022-06-07 NOTE — Discharge Summary (Signed)
Patient ID: Meagan Walls MRN: [id number] DOB/AGE: 1955/11/15 66 y.o.  Admit date: 05/29/2022 Discharge date: 05/30/2022  Admission Diagnoses:  Left knee osteoarthritis  Discharge Diagnoses:  Principal Problem:   S/P total knee arthroplasty, left   Past Medical History:  Diagnosis Date   Arthritis    Chronic kidney disease    GERD (gastroesophageal reflux disease)    History of kidney stones    Hypertension    Pneumonia    PONV (postoperative nausea and vomiting)    Pre-diabetes     Surgeries: Procedure(s): TOTAL KNEE ARTHROPLASTY on 05/29/2022   Consultants:   Discharged Condition: Improved  Hospital Course: Meagan Walls is an 66 y.o. female who was admitted 05/29/2022 for operative treatment ofS/P total knee arthroplasty, left. Patient has severe unremitting pain that affects sleep, daily activities, and work/hobbies. After pre-op clearance the patient was taken to the operating room on 05/29/2022 and underwent  Procedure(s): TOTAL KNEE ARTHROPLASTY.    Patient was given perioperative antibiotics:  Anti-infectives (From admission, onward)    Start     Dose/Rate Route Frequency Ordered Stop   05/29/22 1330  ceFAZolin (ANCEF) IVPB 2g/100 mL premix        2 g 200 mL/hr over 30 Minutes Intravenous Every 6 hours 05/29/22 1148 05/29/22 2102   05/29/22 0600  ceFAZolin (ANCEF) IVPB 2g/100 mL premix        2 g 200 mL/hr over 30 Minutes Intravenous On call to O.R. 05/29/22 4098 05/29/22 0748        Patient was given sequential compression devices, early ambulation, and chemoprophylaxis to prevent DVT. Patient worked with PT and was meeting their goals regarding safe ambulation and transfers.  Patient benefited maximally from hospital stay and there were no complications.    Recent vital signs: No data found.   Recent laboratory studies: No results for input(s): "WBC", "HGB", "HCT", "PLT", "NA", "K", "CL", "CO2", "BUN", "CREATININE", "GLUCOSE", "INR", "CALCIUM" in the  last 72 hours.  Invalid input(s): "PT", "2"   Discharge Medications:   Allergies as of 05/30/2022       Reactions   Cetirizine Hcl Other (See Comments)   Mouth sores   Erythromycin Base Nausea And Vomiting   Levofloxacin Other (See Comments)   Muscle pain   Molds & Smuts    Short Ragweed Pollen Ext    Tape Rash   When left on for extended periods         Medication List     STOP taking these medications    aspirin EC 81 MG tablet Replaced by: aspirin 81 MG chewable tablet   diclofenac 75 MG EC tablet Commonly known as: VOLTAREN   HYDROcodone-acetaminophen 5-325 MG tablet Commonly known as: NORCO/VICODIN   traMADol 50 MG tablet Commonly known as: ULTRAM       TAKE these medications    acetaminophen 500 MG tablet Commonly known as: TYLENOL Take 2 tablets (1,000 mg total) by mouth every 6 (six) hours.   albuterol 108 (90 Base) MCG/ACT inhaler Commonly known as: VENTOLIN HFA Inhale 2 puffs into the lungs every 6 (six) hours as needed for wheezing or shortness of breath.   aspirin 81 MG chewable tablet Chew 1 tablet (81 mg total) by mouth 2 (two) times daily for 28 days. Replaces: aspirin EC 81 MG tablet   atorvastatin 10 MG tablet Commonly known as: LIPITOR Take 10 mg by mouth every evening.   COLLAGEN PO Take 2 Scoops by mouth daily.   diphenhydrAMINE 25 MG tablet  Commonly known as: BENADRYL Take 25 mg by mouth at bedtime as needed for allergies.   docusate sodium 100 MG capsule Commonly known as: COLACE Take 1 capsule (100 mg total) by mouth 2 (two) times daily.   estradiol 0.1 MG/GM vaginal cream Commonly known as: ESTRACE Place 1 Applicatorful vaginally once a week.   famotidine-calcium carbonate-magnesium hydroxide 10-800-165 MG chewable tablet Commonly known as: PEPCID COMPLETE Chew 1 tablet by mouth daily as needed (acid reflux).   fexofenadine 180 MG tablet Commonly known as: ALLEGRA Take 180 mg by mouth daily.    lisinopril-hydrochlorothiazide 20-25 MG tablet Commonly known as: ZESTORETIC Take 1 tablet by mouth daily.   MAGNESIUM GLYCINATE PO Take 240 mg by mouth daily.   Melatonin 5 MG Caps Take 5 mg by mouth daily.   metFORMIN 500 MG tablet Commonly known as: GLUCOPHAGE Take 500 mg by mouth daily.   methocarbamol 500 MG tablet Commonly known as: ROBAXIN Take 1 tablet (500 mg total) by mouth every 6 (six) hours as needed for muscle spasms. What changed:  when to take this reasons to take this additional instructions   Mucinex Maximum Strength 1200 MG Tb12 Generic drug: Guaifenesin Take 1,200 mg by mouth daily as needed (congestion).   Nasacort Allergy 24HR 55 MCG/ACT Aero nasal inhaler Generic drug: triamcinolone Place 2 sprays into the nose every evening.   omeprazole 20 MG capsule Commonly known as: PRILOSEC Take 20 mg by mouth daily as needed (acid relfux).   ondansetron 4 MG tablet Commonly known as: ZOFRAN Take 1 tablet (4 mg total) by mouth every 6 (six) hours as needed for nausea.   OVER THE COUNTER MEDICATION Take 0.25-0.5 capsules by mouth daily as needed (sleep). CBD gummies   oxyCODONE 5 MG immediate release tablet Commonly known as: Oxy IR/ROXICODONE Take 1-2 tablets (5-10 mg total) by mouth every 4 (four) hours as needed for severe pain. Start with 1 tablet every 4 hours as needed. Take 2 only for severe pain.   oxymetazoline 0.05 % nasal spray Commonly known as: AFRIN Place 1 spray into both nostrils 2 (two) times daily as needed for congestion.   polyethylene glycol 17 g packet Commonly known as: MIRALAX / GLYCOLAX Take 17 g by mouth daily as needed for mild constipation.   SUPER B COMPLEX/C PO Take 1 tablet by mouth daily.   TURMERIC PO Take 1,000 mg by mouth 2 (two) times daily.   Vitamin D 50 MCG (2000 UT) tablet Take 4,000 Units by mouth daily.               Discharge Care Instructions  (From admission, onward)           Start      Ordered   05/30/22 0000  Change dressing       Comments: Maintain surgical dressing until follow up in the clinic. If the edges start to pull up, may reinforce with tape. If the dressing is no longer working, may remove and cover with gauze and tape, but must keep the area dry and clean.  Call with any questions or concerns.   05/30/22 0813            Diagnostic Studies: No results found.  Disposition: Discharge disposition: 01-Home or Self Care       Discharge Instructions     Call MD / Call 911   Complete by: As directed    If you experience chest pain or shortness of breath, CALL 911 and be transported  to the hospital emergency room.  If you develope a fever above 101 F, pus (white drainage) or increased drainage or redness at the wound, or calf pain, call your surgeon's office.   Change dressing   Complete by: As directed    Maintain surgical dressing until follow up in the clinic. If the edges start to pull up, may reinforce with tape. If the dressing is no longer working, may remove and cover with gauze and tape, but must keep the area dry and clean.  Call with any questions or concerns.   Constipation Prevention   Complete by: As directed    Drink plenty of fluids.  Prune juice may be helpful.  You may use a stool softener, such as Colace (over the counter) 100 mg twice a day.  Use MiraLax (over the counter) for constipation as needed.   Diet - low sodium heart healthy   Complete by: As directed    Increase activity slowly as tolerated   Complete by: As directed    Weight bearing as tolerated with assist device (walker, cane, etc) as directed, use it as long as suggested by your surgeon or therapist, typically at least 4-6 weeks.   Post-operative opioid taper instructions:   Complete by: As directed    POST-OPERATIVE OPIOID TAPER INSTRUCTIONS: It is important to wean off of your opioid medication as soon as possible. If you do not need pain medication after your  surgery it is ok to stop day one. Opioids include: Codeine, Hydrocodone(Norco, Vicodin), Oxycodone(Percocet, oxycontin) and hydromorphone amongst others.  Long term and even short term use of opiods can cause: Increased pain response Dependence Constipation Depression Respiratory depression And more.  Withdrawal symptoms can include Flu like symptoms Nausea, vomiting And more Techniques to manage these symptoms Hydrate well Eat regular healthy meals Stay active Use relaxation techniques(deep breathing, meditating, yoga) Do Not substitute Alcohol to help with tapering If you have been on opioids for less than two weeks and do not have pain than it is ok to stop all together.  Plan to wean off of opioids This plan should start within one week post op of your joint replacement. Maintain the same interval or time between taking each dose and first decrease the dose.  Cut the total daily intake of opioids by one tablet each day Next start to increase the time between doses. The last dose that should be eliminated is the evening dose.      TED hose   Complete by: As directed    Use stockings (TED hose) for 2 weeks on both leg(s).  You may remove them at night for sleeping.        Follow-up Information     Durene Romans, MD. Schedule an appointment as soon as possible for a visit in 2 week(s).   Specialty: Orthopedic Surgery Contact information: [address] [place] [address] [phone]                  Signed: Cassandria Anger 06/07/2022, 6:55 AM

## 2022-07-04 ENCOUNTER — Other Ambulatory Visit (HOSPITAL_COMMUNITY): Payer: Self-pay

## 2022-07-10 ENCOUNTER — Other Ambulatory Visit (HOSPITAL_COMMUNITY): Payer: Self-pay | Admitting: *Deleted

## 2022-07-10 NOTE — Progress Notes (Signed)
Anesthesia Review:  PCP: Eugenie Birks- LOV 03/20/22  Cardiologist : Chest x-ray : EKG : 05/21/22  Echo : Stress test: Cardiac Cath :  Activity level:  Sleep Study/ CPAP : Fasting Blood Sugar :      / Checks Blood Sugar -- times a day:   Blood Thinner/ Instructions /Last Dose: ASA / Instructions/ Last Dose :   DM- type  Hgba1c- 05/21/22- 5.8  LEft knee 05/29/22

## 2022-07-10 NOTE — Progress Notes (Signed)
SURGICAL WAITING ROOM VISITATION Patients having surgery or a procedure may have no more than 2 support people in the waiting area - these visitors may rotate.   Children under the age of 15 must have an adult with them who is not the patient. If the patient needs to stay at the hospital during part of their recovery, the visitor guidelines for inpatient rooms apply. Pre-op nurse will coordinate an appropriate time for 1 support person to accompany patient in pre-op.  This support person may not rotate.    Please refer to the [facility] website for the visitor guidelines for Inpatients (after your surgery is over and you are in a regular room).       Your procedure is scheduled on:         07/24/22   Report to [facility] Main Entrance    Report to admitting at   [phone]   Call this number if you have problems the morning of surgery [phone]   Do not eat food :After Midnight.   After Midnight you may have the following liquids until _ 0415_____ AM/ DAY OF SURGERY  Water Non-Citrus Juices (without pulp, NO RED) Carbonated Beverages Black Coffee (NO MILK/CREAM OR CREAMERS, sugar ok)  Clear Tea (NO MILK/CREAM OR CREAMERS, sugar ok) regular and decaf                             Plain Jell-O (NO RED)                                           Fruit ices (not with fruit pulp, NO RED)                                     Popsicles (NO RED)                                                               Sports drinks like Gatorade (NO RED)              Drink 2 Ensure/G2 drinks AT 10:00 PM the night before surgery.        The day of surgery:  Drink ONE (1) Pre-Surgery Clear Ensure or G2 at  0415 AM  ( have completed by ) the morning of surgery. Drink in one sitting. Do not sip.  This drink was given to you during your hospital  pre-op appointment visit. Nothing else to drink after completing the  Pre-Surgery Clear Ensure or G2.          If you have questions, please contact  your surgeon's office.        Oral Hygiene is also important to reduce your risk of infection.                                    Remember - BRUSH YOUR TEETH THE MORNING OF SURGERY WITH YOUR REGULAR TOOTHPASTE   Do NOT smoke after Midnight  Take these medicines the morning of surgery with A SIP OF WATER:   Inhalers as usual and bring, allegra, omeprazole   DO NOT TAKE ANY ORAL DIABETIC MEDICATIONS DAY OF YOUR SURGERY  Bring CPAP mask and tubing day of surgery.                              You may not have any metal on your body including hair pins, jewelry, and body piercing             Do not wear make-up, lotions, powders, perfumes/cologne, or deodorant  Do not wear nail polish including gel and S&S, artificial/acrylic nails, or any other type of covering on natural nails including finger and toenails. If you have artificial nails, gel coating, etc. that needs to be removed by a nail salon please have this removed prior to surgery or surgery may need to be canceled/ delayed if the surgeon/ anesthesia feels like they are unable to be safely monitored.   Do not shave  48 hours prior to surgery.               Men may shave face and neck.   Do not bring valuables to the hospital. [facility] IS NOT             RESPONSIBLE   FOR VALUABLES.   Contacts, dentures or bridgework may not be worn into surgery.   Bring small overnight bag day of surgery.   DO NOT BRING YOUR HOME MEDICATIONS TO THE HOSPITAL. PHARMACY WILL DISPENSE MEDICATIONS LISTED ON YOUR MEDICATION LIST TO YOU DURING YOUR ADMISSION IN THE HOSPITAL!    Patients discharged on the day of surgery will not be allowed to drive home.  Someone NEEDS to stay with you for the first 24 hours after anesthesia.   Special Instructions: Bring a copy of your healthcare power of attorney and living will documents the day of surgery if you haven't scanned them before.              Please read over the following fact sheets you were  given: IF YOU HAVE QUESTIONS ABOUT YOUR PRE-OP INSTRUCTIONS PLEASE CALL [phone]   If you received a COVID test during your pre-op visit  it is requested that you wear a mask when out in public, stay away from anyone that may not be feeling well and notify your surgeon if you develop symptoms. If you test positive for Covid or have been in contact with anyone that has tested positive in the last 10 days please notify you surgeon.     [facility] - Preparing for Surgery Before surgery, you can play an important role.  Because skin is not sterile, your skin needs to be as free of germs as possible.  You can reduce the number of germs on your skin by washing with CHG (chlorahexidine gluconate) soap before surgery.  CHG is an antiseptic cleaner which kills germs and bonds with the skin to continue killing germs even after washing. Please DO NOT use if you have an allergy to CHG or antibacterial soaps.  If your skin becomes reddened/irritated stop using the CHG and inform your nurse when you arrive at Short Stay. Do not shave (including legs and underarms) for at least 48 hours prior to the first CHG shower.  You may shave your face/neck. Please follow these instructions carefully:  1.  Shower with CHG Soap the night before  surgery and the  morning of Surgery.  2.  If you choose to wash your hair, wash your hair first as usual with your  normal  shampoo.  3.  After you shampoo, rinse your hair and body thoroughly to remove the  shampoo.                           4.  Use CHG as you would any other liquid soap.  You can apply chg directly  to the skin and wash                       Gently with a scrungie or clean washcloth.  5.  Apply the CHG Soap to your body ONLY FROM THE NECK DOWN.   Do not use on face/ open                           Wound or open sores. Avoid contact with eyes, ears mouth and genitals (private parts).                       Wash face,  Genitals (private parts) with your normal  soap.             6.  Wash thoroughly, paying special attention to the area where your surgery  will be performed.  7.  Thoroughly rinse your body with warm water from the neck down.  8.  DO NOT shower/wash with your normal soap after using and rinsing off  the CHG Soap.                9.  Pat yourself dry with a clean towel.            10.  Wear clean pajamas.            11.  Place clean sheets on your bed the night of your first shower and do not  sleep with pets. Day of Surgery : Do not apply any lotions/deodorants the morning of surgery.  Please wear clean clothes to the hospital/surgery center.  FAILURE TO FOLLOW THESE INSTRUCTIONS MAY RESULT IN THE CANCELLATION OF YOUR SURGERY PATIENT SIGNATURE_________________________________  NURSE SIGNATURE__________________________________  ________________________________________________________________________

## 2022-07-11 ENCOUNTER — Encounter (HOSPITAL_COMMUNITY): Payer: Medicare PPO

## 2022-07-19 NOTE — Progress Notes (Signed)
COVID Vaccine Completed:  Date of COVID positive in last 90 days:  PCP - Elenor Quinones, MD Cardiologist -   Chest x-ray -  EKG - 05-21-22 Epic Stress Test -  ECHO -  Cardiac Cath -  Pacemaker/ICD device last checked: Spinal Cord Stimulator:  Bowel Prep -   Sleep Study - Yes, +sleep apnea CPAP -   Fasting Blood Sugar -  Checks Blood Sugar _____ times a day  Blood Thinner Instructions: Aspirin Instructions: Last Dose:  Activity level:  Can go up a flight of stairs and perform activities of daily living without stopping and without symptoms of chest pain or shortness of breath.  Able to exercise without symptoms  Unable to go up a flight of stairs without symptoms of     Anesthesia review:   Patient denies shortness of breath, fever, cough and chest pain at PAT appointment  Patient verbalized understanding of instructions that were given to them at the PAT appointment. Patient was also instructed that they will need to review over the PAT instructions again at home before surgery.

## 2022-07-20 ENCOUNTER — Inpatient Hospital Stay (HOSPITAL_COMMUNITY)
Admission: RE | Admit: 2022-07-20 | Discharge: 2022-07-20 | Disposition: A | Discharge status: Home / Self Care | Payer: Medicare PPO | Source: Ambulatory Visit

## 2022-07-24 ENCOUNTER — Ambulatory Visit (HOSPITAL_COMMUNITY): Admission: RE | Admit: 2022-07-24 | Payer: Medicare PPO | Source: Home / Self Care | Admitting: Orthopedic Surgery

## 2022-07-24 ENCOUNTER — Encounter (HOSPITAL_COMMUNITY): Admission: RE | Payer: Self-pay | Source: Home / Self Care

## 2022-07-24 DIAGNOSIS — M1711 Unilateral primary osteoarthritis, right knee: Secondary | ICD-10-CM

## 2022-07-24 SURGERY — ARTHROPLASTY, KNEE, TOTAL
Anesthesia: Spinal | Site: Knee | Laterality: Right

## 2022-10-31 ENCOUNTER — Encounter (HOSPITAL_COMMUNITY): Payer: Self-pay

## 2022-10-31 NOTE — Patient Instructions (Addendum)
SURGICAL WAITING ROOM VISITATION  Patients having surgery or a procedure may have no more than 2 support people in the waiting area - these visitors may rotate.    Children under the age of 75 must have an adult with them who is not the patient.  Due to an increase in RSV and influenza rates and associated hospitalizations, children ages 17 and under may not visit patients in [place].  If the patient needs to stay at the hospital during part of their recovery, the visitor guidelines for inpatient rooms apply. Pre-op nurse will coordinate an appropriate time for 1 support person to accompany patient in pre-op.  This support person may not rotate.    Please refer to the [facility] website for the visitor guidelines for Inpatients (after your surgery is over and you are in a regular room).       Your procedure is scheduled on: 11-13-22   Report to [facility] Main Entrance    Report to admitting at       [place] AM   Call this number if you have problems the morning of surgery [phone]   Do not eat food :After Midnight.   After Midnight you may have the following liquids until __0415____ AM  DAY OF SURGERY   then nothing by mouth  Water Non-Citrus Juices (without pulp, NO RED-Apple, White grape, White cranberry) Black Coffee (NO MILK/CREAM OR CREAMERS, sugar ok)  Clear Tea (NO MILK/CREAM OR CREAMERS, sugar ok) regular and decaf                             Plain Jell-O (NO RED)                                           Fruit ices (not with fruit pulp, NO RED)                                     Popsicles (NO RED)                                                               Sports drinks like Gatorade (NO RED)                  The day of surgery:  Drink ONE (1) Pre-Surgery  G2 at  0400AM the morning of surgery. Drink in one sitting. Do not sip.  This drink was given to you during your hospital  pre-op appointment visit. Nothing else to drink after  completing the  Pre-Surgery G2.          If you have questions, please contact your surgeon's office.   FOLLOW  ANY ADDITIONAL PRE OP INSTRUCTIONS YOU RECEIVED FROM YOUR SURGEON'S OFFICE!!!     Oral Hygiene is also important to reduce your risk of infection.                                    Remember -  BRUSH YOUR TEETH THE MORNING OF SURGERY WITH YOUR REGULAR TOOTHPASTE  DENTURES WILL BE REMOVED PRIOR TO SURGERY PLEASE DO NOT APPLY "Poly grip" OR ADHESIVES!!!   Do NOT smoke after Midnight   Take these medicines the morning of surgery with A SIP OF WATER: tylenol if needed, fexofenadine, omeprazole, inhaler(bring rescue inhaler)   DO NOT TAKE ANY ORAL DIABETIC MEDICATIONS DAY OF YOUR SURGERY  Bring CPAP mask and tubing day of surgery.                              You may not have any metal on your body including hair pins, jewelry, and body piercing             Do not wear make-up, lotions, powders, perfumes/cologne, or deodorant  Do not wear nail polish including gel and S&S, artificial/acrylic nails, or any other type of covering on natural nails including finger and toenails. If you have artificial nails, gel coating, etc. that needs to be removed by a nail salon please have this removed prior to surgery or surgery may need to be canceled/ delayed if the surgeon/ anesthesia feels like they are unable to be safely monitored.   Do not shave  48 hours prior to surgery.               Do not bring valuables to the hospital. [place].   Contacts, glasses, dentures or bridgework may not be worn into surgery.   Bring small overnight bag day of surgery.   DO NOT [place]. PHARMACY WILL DISPENSE MEDICATIONS LISTED ON YOUR MEDICATION LIST TO YOU DURING YOUR ADMISSION [place]!    Patients discharged on the day of surgery will not be allowed to drive home.  Someone NEEDS to stay with you for  the first 24 hours after anesthesia.   Special Instructions: Bring a copy of your healthcare power of attorney and living will documents the day of surgery if you haven't scanned them before.              Please read over the following fact sheets you were given: IF [place] [phone]   If you received a COVID test during your pre-op visit  it is requested that you wear a mask when out in public, stay away from anyone that may not be feeling well and notify your surgeon if you develop symptoms. If you test positive for Covid or have been in contact with anyone that has tested positive in the last 10 days please notify you surgeon.    [place] - Preparing for Surgery Before surgery, you can play an important role.  Because skin is not sterile, your skin needs to be as free of germs as possible.  You can reduce the number of germs on your skin by washing with CHG (chlorahexidine gluconate) soap before surgery.  CHG is an antiseptic cleaner which kills germs and bonds with the skin to continue killing germs even after washing. Please DO NOT use if you have an allergy to CHG or antibacterial soaps.  If your skin becomes reddened/irritated stop using the CHG and inform your nurse when you arrive at Short Stay. Do not shave (including legs and underarms) for at least 48 hours  prior to the first CHG shower.  You may shave your face/neck. Please follow these instructions carefully:  1.  Shower with CHG Soap the night before surgery and the  morning of Surgery.  2.  If you choose to wash your hair, wash your hair first as usual with your  normal  shampoo.  3.  After you shampoo, rinse your hair and body thoroughly to remove the  shampoo.                           4.  Use CHG as you would any other liquid soap.  You can apply chg directly  to the skin and wash                       Gently with a scrungie or clean washcloth.  5.  Apply the CHG Soap to  your body ONLY FROM THE NECK DOWN.   Do not use on face/ open                           Wound or open sores. Avoid contact with eyes, ears mouth and genitals (private parts).                       Wash face,  Genitals (private parts) with your normal soap.             6.  Wash thoroughly, paying special attention to the area where your surgery  will be performed.  7.  Thoroughly rinse your body with warm water from the neck down.  8.  DO NOT shower/wash with your normal soap after using and rinsing off  the CHG Soap.                9.  Pat yourself dry with a clean towel.            10.  Wear clean pajamas.            11.  Place clean sheets on your bed the night of your first shower and do not  sleep with pets. Day of Surgery : Do not apply any lotions/deodorants the morning of surgery.  Please wear clean clothes to the hospital/surgery center.  FAILURE TO FOLLOW THESE INSTRUCTIONS MAY RESULT IN THE CANCELLATION OF YOUR SURGERY PATIENT SIGNATURE_________________________________  NURSE SIGNATURE__________________________________  ________________________________________________________________________  Adam Phenix  An incentive spirometer is a tool that can help keep your lungs clear and active. This tool measures how well you are filling your lungs with each breath. Taking long deep breaths may help reverse or decrease the chance of developing breathing (pulmonary) problems (especially infection) following: A long period of time when you are unable to move or be active. BEFORE THE PROCEDURE  If the spirometer includes an indicator to show your best effort, your nurse or respiratory therapist will set it to a desired goal. If possible, sit up straight or lean slightly forward. Try not to slouch. Hold the incentive spirometer in an upright position. INSTRUCTIONS FOR USE  Sit on the edge of your bed if possible, or sit up as far as you can in bed or on a chair. Hold the incentive  spirometer in an upright position. Breathe out normally. Place the mouthpiece in your mouth and seal your lips tightly around it. Breathe in slowly and as deeply as possible, raising the piston  or the ball toward the top of the column. Hold your breath for 3-5 seconds or for as long as possible. Allow the piston or ball to fall to the bottom of the column. Remove the mouthpiece from your mouth and breathe out normally. Rest for a few seconds and repeat Steps 1 through 7 at least 10 times every 1-2 hours when you are awake. Take your time and take a few normal breaths between deep breaths. The spirometer may include an indicator to show your best effort. Use the indicator as a goal to work toward during each repetition. After each set of 10 deep breaths, practice coughing to be sure your lungs are clear. If you have an incision (the cut made at the time of surgery), support your incision when coughing by placing a pillow or rolled up towels firmly against it. Once you are able to get out of bed, walk around indoors and cough well. You may stop using the incentive spirometer when instructed by your caregiver.  RISKS AND COMPLICATIONS Take your time so you do not get dizzy or light-headed. If you are in pain, you may need to take or ask for pain medication before doing incentive spirometry. It is harder to take a deep breath if you are having pain. AFTER USE Rest and breathe slowly and easily. It can be helpful to keep track of a log of your progress. Your caregiver can provide you with a simple table to help with this. If you are using the spirometer at home, follow these instructions: SEEK MEDICAL CARE IF:  You are having difficultly using the spirometer. You have trouble using the spirometer as often as instructed. Your pain medication is not giving enough relief while using the spirometer. You develop fever of 100.5 F (38.1 C) or higher. SEEK IMMEDIATE MEDICAL CARE IF:  You cough up bloody  sputum that had not been present before. You develop fever of 102 F (38.9 C) or greater. You develop worsening pain at or near the incision site. MAKE SURE YOU:  Understand these instructions. Will watch your condition. Will get help right away if you are not doing well or get worse. Document Released: 01/28/2007 Document Revised: 12/10/2011 Document Reviewed: 03/31/2007 [facility] Patient Information 2014 [place], [place].   ________________________________________________________________________

## 2022-11-05 ENCOUNTER — Other Ambulatory Visit (HOSPITAL_COMMUNITY): Payer: Medicare PPO

## 2022-11-06 ENCOUNTER — Encounter (HOSPITAL_COMMUNITY)
Admission: RE | Admit: 2022-11-06 | Discharge: 2022-11-06 | Disposition: A | Discharge status: Home / Self Care | Payer: Medicare PPO | Source: Ambulatory Visit | Attending: Orthopedic Surgery | Admitting: Orthopedic Surgery

## 2022-11-06 ENCOUNTER — Other Ambulatory Visit: Payer: Self-pay

## 2022-11-06 ENCOUNTER — Encounter (HOSPITAL_COMMUNITY): Payer: Self-pay

## 2022-11-06 VITALS — BP 145/93 | HR 77 | Temp 98.3°F | Resp 16 | Ht 63.5 in | Wt 228.0 lb

## 2022-11-06 DIAGNOSIS — Z01812 Encounter for preprocedural laboratory examination: Secondary | ICD-10-CM | POA: Insufficient documentation

## 2022-11-06 DIAGNOSIS — I10 Essential (primary) hypertension: Secondary | ICD-10-CM | POA: Insufficient documentation

## 2022-11-06 DIAGNOSIS — R7303 Prediabetes: Secondary | ICD-10-CM | POA: Insufficient documentation

## 2022-11-06 DIAGNOSIS — Z01818 Encounter for other preprocedural examination: Secondary | ICD-10-CM

## 2022-11-06 HISTORY — DX: Sleep apnea, unspecified: G47.30

## 2022-11-06 LAB — BASIC METABOLIC PANEL
Anion gap: 9 (ref 5–15)
BUN: 21 mg/dL (ref 8–23)
CO2: 23 mmol/L (ref 22–32)
Calcium: 9.3 mg/dL (ref 8.9–10.3)
Chloride: 103 mmol/L (ref 98–111)
Creatinine, Ser: 0.93 mg/dL (ref 0.44–1.00)
GFR, Estimated: >60 mL/min (ref >60)
Glucose, Bld: 119 mg/dL — ABNORMAL HIGH (ref 70–99)
Potassium: 4.3 mmol/L (ref 3.5–5.1)
Sodium: 135 mmol/L (ref 135–145)

## 2022-11-06 LAB — SURGICAL PCR SCREEN
MRSA, PCR: NEGATIVE
Staphylococcus aureus: NEGATIVE

## 2022-11-06 LAB — CBC
HCT: 42.5 % (ref 36.0–46.0)
Hemoglobin: 13.9 g/dL (ref 12.0–15.0)
MCH: 29.9 pg (ref 26.0–34.0)
MCHC: 32.7 g/dL (ref 30.0–36.0)
MCV: 91.4 fL (ref 80.0–100.0)
Platelets: 265 10*3/uL (ref 150–400)
RBC: 4.65 MIL/uL (ref 3.87–5.11)
RDW: 13.2 % (ref 11.5–15.5)
WBC: 7.9 10*3/uL (ref 4.0–10.5)
nRBC: 0 % (ref 0.0–0.2)

## 2022-11-06 LAB — GLUCOSE, CAPILLARY: Glucose-Capillary: 133 mg/dL — ABNORMAL HIGH (ref 70–99)

## 2022-11-06 LAB — HEMOGLOBIN A1C
Hgb A1c MFr Bld: 5.8 % — ABNORMAL HIGH (ref 4.8–5.6)
Mean Plasma Glucose: 119.76 mg/dL

## 2022-11-06 NOTE — Progress Notes (Addendum)
PCP - Elenor Quinones , MD Cardiologist - no  PPM/ICD -  Device Orders -  Rep Notified -   Chest x-ray -  EKG - 05-21-22 Stress Test -  ECHO -  Cardiac Cath -   Sleep Study -  CPAP -   Fasting Blood Sugar -  Checks Blood Sugar __0___ times a day  Blood Thinner Instructions: Aspirin Instructions:81mg   ERAS Protcol - PRE-SURGERY G2-    COVID vaccine -yes  Activity--Able to climb a flight of stairs without SOB or CP Anesthesia review: pre-DM, HTN, CKD, OSA  Patient denies shortness of breath, fever, cough and chest pain at PAT appointment   All instructions explained to the patient, with a verbal understanding of the material. Patient agrees to go over the instructions while at home for a better understanding. Patient also instructed to self quarantine after being tested for COVID-19. The opportunity to ask questions was provided.

## 2022-11-12 NOTE — H&P (Signed)
TOTAL KNEE ADMISSION H&P  Patient is being admitted for right total knee arthroplasty.  Subjective:  Chief Complaint:right knee pain.  HPI: Meagan Walls, 67 y.o. female, has a history of pain and functional disability in the right knee due to arthritis and has failed non-surgical conservative treatments for greater than 12 weeks to includeNSAID's and/or analgesics, corticosteriod injections, and activity modification.  Onset of symptoms was gradual, starting 2 years ago with gradually worsening course since that time. The patient noted no past surgery on the right knee(s).  Patient currently rates pain in the right knee(s) at 8 out of 10 with activity. Patient has worsening of pain with activity and weight bearing, pain that interferes with activities of daily living, and pain with passive range of motion.  Patient has evidence of joint space narrowing by imaging studies.  There is no active infection.  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   GERD (gastroesophageal reflux disease)    History of kidney stones    Hypertension    Pneumonia    PONV (postoperative nausea and vomiting)    Pre-diabetes    Sleep apnea    no cpap    Past Surgical History:  Procedure Laterality Date   ABDOMINAL HYSTERECTOMY     BACK SURGERY     CESAREAN SECTION     CHOLECYSTECTOMY     knee scope surgery      myomectomy     PAROTIDECTOMY     shoulder scope surgery      TOTAL KNEE ARTHROPLASTY Left 05/29/2022   Procedure: TOTAL KNEE ARTHROPLASTY;  Surgeon: Paralee Cancel, MD;  Location: [facility];  Service: Orthopedics;  Laterality: Left;   TURBINATE REDUCTION      No current facility-administered medications for this encounter.   Current Outpatient Medications  Medication Sig Dispense Refill  Last Dose   acetaminophen (TYLENOL) 500 MG tablet Take 2 tablets (1,000 mg total) by mouth every 6 (six) hours. (Patient taking differently: Take 1,000 mg by mouth every 6 (six) hours as needed for mild pain, moderate pain or headache.) 30 tablet 0    albuterol (VENTOLIN HFA) 108 (90 Base) MCG/ACT inhaler Inhale 2 puffs into the lungs every 6 (six) hours as needed for wheezing or shortness of breath.      aspirin 81 MG chewable tablet Chew 81 mg by mouth daily.      atorvastatin (LIPITOR) 10 MG tablet Take 10 mg by mouth every evening.      Cholecalciferol (VITAMIN D) 50 MCG (2000 UT) tablet Take 4,000 Units by mouth daily.      diclofenac (VOLTAREN) 75 MG EC tablet Take 75 mg by mouth daily.      diphenhydrAMINE (BENADRYL) 25 MG tablet Take 25 mg by mouth at bedtime.      famotidine-calcium carbonate-magnesium hydroxide (PEPCID COMPLETE) 10-800-165 MG chewable tablet Chew 1 tablet by mouth daily as needed (acid reflux).      fexofenadine (ALLEGRA) 180 MG tablet Take 180 mg by mouth daily.      Guaifenesin (MUCINEX MAXIMUM STRENGTH) 1200 MG TB12 Take 1,200 mg by mouth daily as needed (congestion).      lisinopril-hydrochlorothiazide (ZESTORETIC) 20-25 MG tablet Take 1 tablet by mouth daily.      Melatonin 5 MG  CAPS Take 5 mg by mouth at bedtime.      metFORMIN (GLUCOPHAGE) 500 MG tablet Take 500 mg by mouth daily.      methocarbamol (ROBAXIN) 500 MG tablet Take 1 tablet (500 mg total) by mouth every 6 (six) hours as needed for muscle spasms. (Patient taking differently: Take 500 mg by mouth daily.) 40 tablet 0    omeprazole (PRILOSEC) 20 MG capsule Take 20 mg by mouth daily.      oxymetazoline (AFRIN) 0.05 % nasal spray Place 1 spray into both nostrils 2 (two) times daily as needed for congestion.      triamcinolone (NASACORT ALLERGY 24HR) 55 MCG/ACT AERO nasal inhaler Place 2 sprays into the nose every evening.      TURMERIC PO Take 1,000 mg by mouth 2 (two) times daily.      zolpidem (AMBIEN) 10  MG tablet Take 5 mg by mouth at bedtime.      docusate sodium (COLACE) 100 MG capsule Take 1 capsule (100 mg total) by mouth 2 (two) times daily. (Patient not taking: Reported on 11/05/2022) 10 capsule 0 Not Taking   ondansetron (ZOFRAN) 4 MG tablet Take 1 tablet (4 mg total) by mouth every 6 (six) hours as needed for nausea. (Patient not taking: Reported on 11/05/2022) 20 tablet 0 Not Taking   polyethylene glycol (MIRALAX / GLYCOLAX) 17 g packet Take 17 g by mouth daily as needed for mild constipation. (Patient not taking: Reported on 11/05/2022) 14 each 0 Not Taking   Allergies  Allergen Reactions   Cetirizine Hcl Other (See Comments)    Mouth sores   Erythromycin Base Nausea And Vomiting   Levofloxacin Other (See Comments)    Muscle pain   Hydromorphone Nausea Only   Molds & Smuts    Short Ragweed Pollen Ext    Tape Rash    When left on for extended periods     Social History   Tobacco Use   Smoking status: Never   Smokeless tobacco: Never  Substance Use Topics   Alcohol use: Yes    Comment: occas    No family history on file.   Review of Systems  Constitutional:  Negative for chills and fever.  Respiratory:  Negative for cough and shortness of breath.   Cardiovascular:  Negative for chest pain.  Gastrointestinal:  Negative for nausea and vomiting.  Musculoskeletal:  Positive for arthralgias.     Objective:  Physical Exam Well nourished and well developed. General: Alert and oriented x3, cooperative and pleasant, no acute distress. Head: normocephalic, atraumatic, neck supple. Eyes: EOMI.  Musculoskeletal: Left knee exam: Her surgical incision remains well-healed without signs of infection She has near full active and passive extension and flexes close to 120 degrees Right knee exam: No palpable effusion, warmth erythema Genu varum associated with slight flexion contracture with flexion over 110 degrees  Calves soft and nontender. Motor function intact in LE.  Strength 5/5 LE bilaterally. Neuro: Distal pulses 2+. Sensation to light touch intact in LE.  Vital signs in last 24 hours:    Labs:   Estimated body mass index is 39.75 kg/m as calculated from the following:   Height as of 11/06/22: 5' 3.5" (1.613 m).   Weight as of 11/06/22: 103.4 kg.   Imaging Review Plain radiographs demonstrate severe degenerative joint disease of the right knee(s). The overall alignment isneutral. The bone quality appears to be adequate for age and reported activity level.      Assessment/Plan:  End  stage arthritis, right knee   The patient history, physical examination, clinical judgment of the provider and imaging studies are consistent with end stage degenerative joint disease of the right knee(s) and total knee arthroplasty is deemed medically necessary. The treatment options including medical management, injection therapy arthroscopy and arthroplasty were discussed at length. The risks and benefits of total knee arthroplasty were presented and reviewed. The risks due to aseptic loosening, infection, stiffness, patella tracking problems, thromboembolic complications and other imponderables were discussed. The patient acknowledged the explanation, agreed to proceed with the plan and consent was signed. Patient is being admitted for inpatient treatment for surgery, pain control, PT, OT, prophylactic antibiotics, VTE prophylaxis, progressive ambulation and ADL's and discharge planning. The patient is planning to be discharged  home.   Therapy Plans: outpatient therapy Disposition: Home with husband Planned DVT Prophylaxis: aspirin 22m BID DME needed: none PCP: Dr. BOwens Shark clearance received TXA: IV Allergies: dilaudid - nausea, erythromycin - abdominal pain, zyrtec - Anesthesia Concerns: throat very sore & swollen afterwards (spinal) BMI: 41.4 (wt check on Friday) Last HgbA1c: Not diabetic   Other: - oxycodone, try tizanidine, tylenol, celebrex - No hx  of VTE, or cancer  Patient's anticipated LOS is less than 2 midnights, meeting these requirements: - Younger than 655- Lives within 1 hour of care - Has a competent adult at home to recover with post-op recover - NO history of  - Chronic pain requiring opiods  - Diabetes  - Coronary Artery Disease  - Heart failure  - Heart attack  - Stroke  - DVT/VTE  - Cardiac arrhythmia  - Respiratory Failure/COPD  - Renal failure  - Anemia  - Advanced Liver disease   ACostella Hatcher PA-C Orthopedic Surgery [facility] [place] ([phone]

## 2022-11-13 ENCOUNTER — Other Ambulatory Visit: Payer: Self-pay

## 2022-11-13 ENCOUNTER — Ambulatory Visit (HOSPITAL_COMMUNITY): Payer: Medicare PPO | Admitting: Anesthesiology

## 2022-11-13 ENCOUNTER — Encounter (HOSPITAL_COMMUNITY)
Admission: RE | Disposition: A | Discharge status: Home / Self Care | Payer: Self-pay | Source: Home / Self Care | Attending: Orthopedic Surgery

## 2022-11-13 ENCOUNTER — Observation Stay (HOSPITAL_COMMUNITY)
Admission: RE | Admit: 2022-11-13 | Discharge: 2022-11-14 | Disposition: A | Discharge status: Home / Self Care | Payer: Medicare PPO | Attending: Orthopedic Surgery | Admitting: Orthopedic Surgery

## 2022-11-13 ENCOUNTER — Ambulatory Visit (HOSPITAL_BASED_OUTPATIENT_CLINIC_OR_DEPARTMENT_OTHER): Payer: Medicare PPO | Admitting: Anesthesiology

## 2022-11-13 ENCOUNTER — Encounter (HOSPITAL_COMMUNITY): Payer: Self-pay | Admitting: Orthopedic Surgery

## 2022-11-13 DIAGNOSIS — M659 Synovitis and tenosynovitis, unspecified: Secondary | ICD-10-CM

## 2022-11-13 DIAGNOSIS — N189 Chronic kidney disease, unspecified: Secondary | ICD-10-CM | POA: Insufficient documentation

## 2022-11-13 DIAGNOSIS — I129 Hypertensive chronic kidney disease with stage 1 through stage 4 chronic kidney disease, or unspecified chronic kidney disease: Secondary | ICD-10-CM | POA: Insufficient documentation

## 2022-11-13 DIAGNOSIS — Z7984 Long term (current) use of oral hypoglycemic drugs: Secondary | ICD-10-CM | POA: Diagnosis not present

## 2022-11-13 DIAGNOSIS — M25461 Effusion, right knee: Secondary | ICD-10-CM

## 2022-11-13 DIAGNOSIS — M25761 Osteophyte, right knee: Secondary | ICD-10-CM

## 2022-11-13 DIAGNOSIS — I10 Essential (primary) hypertension: Secondary | ICD-10-CM

## 2022-11-13 DIAGNOSIS — M1711 Unilateral primary osteoarthritis, right knee: Secondary | ICD-10-CM | POA: Diagnosis not present

## 2022-11-13 DIAGNOSIS — Z96652 Presence of left artificial knee joint: Secondary | ICD-10-CM | POA: Diagnosis not present

## 2022-11-13 DIAGNOSIS — Z79899 Other long term (current) drug therapy: Secondary | ICD-10-CM | POA: Diagnosis not present

## 2022-11-13 DIAGNOSIS — Z7982 Long term (current) use of aspirin: Secondary | ICD-10-CM | POA: Diagnosis not present

## 2022-11-13 DIAGNOSIS — R7303 Prediabetes: Secondary | ICD-10-CM

## 2022-11-13 DIAGNOSIS — Z96651 Presence of right artificial knee joint: Secondary | ICD-10-CM

## 2022-11-13 HISTORY — PX: TOTAL KNEE ARTHROPLASTY: SHX125

## 2022-11-13 SURGERY — ARTHROPLASTY, KNEE, TOTAL
Anesthesia: Regional | Site: Knee | Laterality: Right

## 2022-11-13 MED ORDER — LORATADINE 10 MG PO TABS
10.0000 mg | ORAL_TABLET | Freq: Every day | ORAL | Status: DC
  Administered 2022-11-14: 10 mg via ORAL
  Filled 2022-11-13: qty 1

## 2022-11-13 MED ORDER — MENTHOL 3 MG MT LOZG: 1.0000 | LOZENGE | OROMUCOSAL | Status: DC | PRN

## 2022-11-13 MED ORDER — ATORVASTATIN CALCIUM 10 MG PO TABS
10.0000 mg | ORAL_TABLET | Freq: Every evening | ORAL | Status: DC
  Administered 2022-11-13: 10 mg via ORAL
  Filled 2022-11-13: qty 1

## 2022-11-13 MED ORDER — TRANEXAMIC ACID-NACL 1000-0.7 MG/100ML-% IV SOLN
1000.0000 mg | INTRAVENOUS | Status: AC
  Administered 2022-11-13: 1000 mg via INTRAVENOUS
  Filled 2022-11-13: qty 100

## 2022-11-13 MED ORDER — ONDANSETRON HCL 4 MG/2ML IJ SOLN: 4.0000 mg | Freq: Four times a day (QID) | INTRAMUSCULAR | Status: DC | PRN

## 2022-11-13 MED ORDER — TRANEXAMIC ACID-NACL 1000-0.7 MG/100ML-% IV SOLN
1000.0000 mg | Freq: Once | INTRAVENOUS | Status: AC
  Administered 2022-11-13: 1000 mg via INTRAVENOUS
  Filled 2022-11-13: qty 100

## 2022-11-13 MED ORDER — ONDANSETRON HCL 4 MG/2ML IJ SOLN
INTRAMUSCULAR | Status: AC
  Filled 2022-11-13: qty 2

## 2022-11-13 MED ORDER — MIDAZOLAM HCL 5 MG/5ML IJ SOLN
INTRAMUSCULAR | Status: DC | PRN
  Administered 2022-11-13: 2 mg via INTRAVENOUS

## 2022-11-13 MED ORDER — FENTANYL CITRATE (PF) 100 MCG/2ML IJ SOLN
INTRAMUSCULAR | Status: DC | PRN
  Administered 2022-11-13 (×2): 50 ug via INTRAVENOUS

## 2022-11-13 MED ORDER — ACETAMINOPHEN 500 MG PO TABS
1000.0000 mg | ORAL_TABLET | Freq: Four times a day (QID) | ORAL | Status: DC
  Administered 2022-11-13 – 2022-11-14 (×4): 1000 mg via ORAL
  Filled 2022-11-13 (×4): qty 2

## 2022-11-13 MED ORDER — LACTATED RINGERS IV SOLN: INTRAVENOUS | Status: DC

## 2022-11-13 MED ORDER — LISINOPRIL-HYDROCHLOROTHIAZIDE 20-25 MG PO TABS: 1.0000 | ORAL_TABLET | Freq: Every day | ORAL | Status: DC

## 2022-11-13 MED ORDER — OXYCODONE HCL 5 MG PO TABS
10.0000 mg | ORAL_TABLET | ORAL | Status: DC | PRN
  Administered 2022-11-13 – 2022-11-14 (×2): 10 mg via ORAL
  Filled 2022-11-13 (×2): qty 2

## 2022-11-13 MED ORDER — CEFAZOLIN SODIUM-DEXTROSE 2-4 GM/100ML-% IV SOLN: 2.0000 g | Freq: Four times a day (QID) | INTRAVENOUS | Status: DC

## 2022-11-13 MED ORDER — MIDAZOLAM HCL 2 MG/2ML IJ SOLN
INTRAMUSCULAR | Status: AC
  Filled 2022-11-13: qty 2

## 2022-11-13 MED ORDER — CEFAZOLIN SODIUM-DEXTROSE 2-4 GM/100ML-% IV SOLN
2.0000 g | INTRAVENOUS | Status: AC
  Administered 2022-11-13: 2 g via INTRAVENOUS
  Filled 2022-11-13: qty 100

## 2022-11-13 MED ORDER — ORAL CARE MOUTH RINSE: 15.0000 mL | Freq: Once | OROMUCOSAL | Status: AC

## 2022-11-13 MED ORDER — CELECOXIB 200 MG PO CAPS
200.0000 mg | ORAL_CAPSULE | Freq: Two times a day (BID) | ORAL | Status: DC
  Administered 2022-11-13 – 2022-11-14 (×2): 200 mg via ORAL
  Filled 2022-11-13 (×2): qty 1

## 2022-11-13 MED ORDER — PROPOFOL 500 MG/50ML IV EMUL
INTRAVENOUS | Status: AC
  Filled 2022-11-13: qty 50

## 2022-11-13 MED ORDER — ZOLPIDEM TARTRATE 5 MG PO TABS
5.0000 mg | ORAL_TABLET | Freq: Every day | ORAL | Status: DC
  Administered 2022-11-13: 5 mg via ORAL
  Filled 2022-11-13: qty 1

## 2022-11-13 MED ORDER — POVIDONE-IODINE 10 % EX SWAB
2.0000 | Freq: Once | CUTANEOUS | Status: AC
  Administered 2022-11-13: 2 via TOPICAL

## 2022-11-13 MED ORDER — ACETAMINOPHEN 325 MG PO TABS: 325.0000 mg | ORAL_TABLET | Freq: Four times a day (QID) | ORAL | Status: DC | PRN

## 2022-11-13 MED ORDER — TIZANIDINE HCL 4 MG PO TABS
2.0000 mg | ORAL_TABLET | Freq: Three times a day (TID) | ORAL | Status: DC | PRN
  Administered 2022-11-13 – 2022-11-14 (×3): 2 mg via ORAL
  Filled 2022-11-13 (×3): qty 1

## 2022-11-13 MED ORDER — DOCUSATE SODIUM 100 MG PO CAPS
100.0000 mg | ORAL_CAPSULE | Freq: Two times a day (BID) | ORAL | Status: DC
  Administered 2022-11-13 – 2022-11-14 (×2): 100 mg via ORAL
  Filled 2022-11-13 (×2): qty 1

## 2022-11-13 MED ORDER — ASPIRIN 81 MG PO CHEW
81.0000 mg | CHEWABLE_TABLET | Freq: Two times a day (BID) | ORAL | Status: DC
  Administered 2022-11-13 – 2022-11-14 (×2): 81 mg via ORAL
  Filled 2022-11-13 (×2): qty 1

## 2022-11-13 MED ORDER — EPINEPHRINE PF 1 MG/ML IJ SOLN
INTRAMUSCULAR | Status: AC
  Filled 2022-11-13: qty 1

## 2022-11-13 MED ORDER — HYDROMORPHONE HCL 1 MG/ML IJ SOLN: 0.5000 mg | INTRAMUSCULAR | Status: DC | PRN

## 2022-11-13 MED ORDER — BUPIVACAINE IN DEXTROSE 0.75-8.25 % IT SOLN
INTRATHECAL | Status: DC | PRN
  Administered 2022-11-13: 1.6 mL via INTRATHECAL

## 2022-11-13 MED ORDER — KETOROLAC TROMETHAMINE 30 MG/ML IJ SOLN
INTRAMUSCULAR | Status: DC | PRN
  Administered 2022-11-13: 30 mg via INTRAMUSCULAR

## 2022-11-13 MED ORDER — HYDROCHLOROTHIAZIDE 25 MG PO TABS
25.0000 mg | ORAL_TABLET | Freq: Every day | ORAL | Status: DC
  Administered 2022-11-14: 25 mg via ORAL
  Filled 2022-11-13: qty 1

## 2022-11-13 MED ORDER — DEXAMETHASONE SODIUM PHOSPHATE 10 MG/ML IJ SOLN
10.0000 mg | Freq: Once | INTRAMUSCULAR | Status: AC
  Administered 2022-11-14: 10 mg via INTRAVENOUS
  Filled 2022-11-13: qty 1

## 2022-11-13 MED ORDER — ONDANSETRON HCL 4 MG/2ML IJ SOLN
INTRAMUSCULAR | Status: DC | PRN
  Administered 2022-11-13: 4 mg via INTRAVENOUS

## 2022-11-13 MED ORDER — PHENOL 1.4 % MT LIQD: 1.0000 | OROMUCOSAL | Status: DC | PRN

## 2022-11-13 MED ORDER — BUPIVACAINE-EPINEPHRINE (PF) 0.25% -1:200000 IJ SOLN
INTRAMUSCULAR | Status: DC | PRN
  Administered 2022-11-13: 30 mL

## 2022-11-13 MED ORDER — CHLORHEXIDINE GLUCONATE 0.12 % MT SOLN
15.0000 mL | Freq: Once | OROMUCOSAL | Status: AC
  Administered 2022-11-13: 15 mL via OROMUCOSAL

## 2022-11-13 MED ORDER — 0.9 % SODIUM CHLORIDE (POUR BTL) OPTIME
TOPICAL | Status: DC | PRN
  Administered 2022-11-13 (×2): 1000 mL

## 2022-11-13 MED ORDER — POLYETHYLENE GLYCOL 3350 17 G PO PACK
17.0000 g | PACK | Freq: Two times a day (BID) | ORAL | Status: DC
  Administered 2022-11-13 – 2022-11-14 (×2): 17 g via ORAL
  Filled 2022-11-13 (×2): qty 1

## 2022-11-13 MED ORDER — SODIUM CHLORIDE 0.9 % IR SOLN
Status: DC | PRN
  Administered 2022-11-13: 1000 mL

## 2022-11-13 MED ORDER — SODIUM CHLORIDE 0.9 % IV SOLN: INTRAVENOUS | Status: DC

## 2022-11-13 MED ORDER — LISINOPRIL 20 MG PO TABS
20.0000 mg | ORAL_TABLET | Freq: Every day | ORAL | Status: DC
  Administered 2022-11-14: 20 mg via ORAL
  Filled 2022-11-13: qty 1

## 2022-11-13 MED ORDER — PHENYLEPHRINE HCL-NACL 20-0.9 MG/250ML-% IV SOLN
INTRAVENOUS | Status: DC | PRN
  Administered 2022-11-13: 50 ug/min via INTRAVENOUS

## 2022-11-13 MED ORDER — ONDANSETRON HCL 4 MG PO TABS
4.0000 mg | ORAL_TABLET | Freq: Four times a day (QID) | ORAL | Status: DC | PRN
  Administered 2022-11-13: 4 mg via ORAL
  Filled 2022-11-13: qty 1

## 2022-11-13 MED ORDER — BISACODYL 10 MG RE SUPP: 10.0000 mg | Freq: Every day | RECTAL | Status: DC | PRN

## 2022-11-13 MED ORDER — PROPOFOL 10 MG/ML IV BOLUS
INTRAVENOUS | Status: DC | PRN
  Administered 2022-11-13 (×2): 20 mg via INTRAVENOUS

## 2022-11-13 MED ORDER — DIPHENHYDRAMINE HCL 12.5 MG/5ML PO ELIX: 12.5000 mg | ORAL_SOLUTION | ORAL | Status: DC | PRN

## 2022-11-13 MED ORDER — AMISULPRIDE (ANTIEMETIC) 5 MG/2ML IV SOLN: 10.0000 mg | Freq: Once | INTRAVENOUS | Status: DC | PRN

## 2022-11-13 MED ORDER — PROPOFOL 1000 MG/100ML IV EMUL
INTRAVENOUS | Status: AC
  Filled 2022-11-13: qty 100

## 2022-11-13 MED ORDER — PROPOFOL 500 MG/50ML IV EMUL
INTRAVENOUS | Status: DC | PRN
  Administered 2022-11-13: 100 ug/kg/min via INTRAVENOUS

## 2022-11-13 MED ORDER — BUPIVACAINE-EPINEPHRINE (PF) 0.5% -1:200000 IJ SOLN
INTRAMUSCULAR | Status: DC | PRN
  Administered 2022-11-13: 30 mL via PERINEURAL

## 2022-11-13 MED ORDER — SODIUM CHLORIDE (PF) 0.9 % IJ SOLN
INTRAMUSCULAR | Status: AC
  Filled 2022-11-13: qty 30

## 2022-11-13 MED ORDER — FAMOTIDINE-CA CARB-MAG HYDROX 10-800-165 MG PO CHEW: 1.0000 | CHEWABLE_TABLET | Freq: Every day | ORAL | Status: DC | PRN

## 2022-11-13 MED ORDER — FENTANYL CITRATE (PF) 100 MCG/2ML IJ SOLN
INTRAMUSCULAR | Status: AC
  Filled 2022-11-13: qty 2

## 2022-11-13 MED ORDER — SODIUM CHLORIDE (PF) 0.9 % IJ SOLN
INTRAMUSCULAR | Status: DC | PRN
  Administered 2022-11-13: 30 mL via INTRAVENOUS

## 2022-11-13 MED ORDER — BUPIVACAINE HCL (PF) 0.25 % IJ SOLN
INTRAMUSCULAR | Status: AC
  Filled 2022-11-13: qty 30

## 2022-11-13 MED ORDER — ALBUTEROL SULFATE (2.5 MG/3ML) 0.083% IN NEBU: 2.5000 mg | INHALATION_SOLUTION | Freq: Four times a day (QID) | RESPIRATORY_TRACT | Status: DC | PRN

## 2022-11-13 MED ORDER — DEXAMETHASONE SODIUM PHOSPHATE 10 MG/ML IJ SOLN
INTRAMUSCULAR | Status: AC
  Filled 2022-11-13: qty 1

## 2022-11-13 MED ORDER — ACETAMINOPHEN 10 MG/ML IV SOLN: 1000.0000 mg | Freq: Once | INTRAVENOUS | Status: DC | PRN

## 2022-11-13 MED ORDER — PANTOPRAZOLE SODIUM 40 MG PO TBEC
40.0000 mg | DELAYED_RELEASE_TABLET | Freq: Every day | ORAL | Status: DC
  Administered 2022-11-14: 40 mg via ORAL
  Filled 2022-11-13: qty 1

## 2022-11-13 MED ORDER — METOCLOPRAMIDE HCL 5 MG PO TABS: 5.0000 mg | ORAL_TABLET | Freq: Three times a day (TID) | ORAL | Status: DC | PRN

## 2022-11-13 MED ORDER — DEXAMETHASONE SODIUM PHOSPHATE 10 MG/ML IJ SOLN
8.0000 mg | Freq: Once | INTRAMUSCULAR | Status: AC
  Administered 2022-11-13: 8 mg via INTRAVENOUS

## 2022-11-13 MED ORDER — MELATONIN 5 MG PO TABS
5.0000 mg | ORAL_TABLET | Freq: Every day | ORAL | Status: DC
  Administered 2022-11-13: 5 mg via ORAL
  Filled 2022-11-13: qty 1

## 2022-11-13 MED ORDER — METOCLOPRAMIDE HCL 5 MG/ML IJ SOLN: 5.0000 mg | Freq: Three times a day (TID) | INTRAMUSCULAR | Status: DC | PRN

## 2022-11-13 MED ORDER — OXYCODONE HCL 5 MG PO TABS
5.0000 mg | ORAL_TABLET | ORAL | Status: DC | PRN
  Administered 2022-11-13 (×2): 5 mg via ORAL
  Filled 2022-11-13 (×2): qty 1

## 2022-11-13 MED ORDER — FENTANYL CITRATE PF 50 MCG/ML IJ SOSY: 25.0000 ug | PREFILLED_SYRINGE | INTRAMUSCULAR | Status: DC | PRN

## 2022-11-13 MED ORDER — CEFAZOLIN SODIUM-DEXTROSE 2-4 GM/100ML-% IV SOLN
2.0000 g | Freq: Four times a day (QID) | INTRAVENOUS | Status: AC
  Administered 2022-11-13 (×2): 2 g via INTRAVENOUS
  Filled 2022-11-13 (×2): qty 100

## 2022-11-13 MED ORDER — METFORMIN HCL 500 MG PO TABS
500.0000 mg | ORAL_TABLET | Freq: Every day | ORAL | Status: DC
  Administered 2022-11-14: 500 mg via ORAL
  Filled 2022-11-13: qty 1

## 2022-11-13 MED ORDER — ONDANSETRON HCL 4 MG/2ML IJ SOLN: 4.0000 mg | Freq: Once | INTRAMUSCULAR | Status: DC | PRN

## 2022-11-13 MED ORDER — KETOROLAC TROMETHAMINE 30 MG/ML IJ SOLN
INTRAMUSCULAR | Status: AC
  Filled 2022-11-13: qty 1

## 2022-11-13 SURGICAL SUPPLY — 51 items
ATTUNE MED ANAT PAT 35 KNEE (Knees) IMPLANT
BAG COUNTER SPONGE SURGICOUNT (BAG) IMPLANT
BAG ZIPLOCK 12X15 (MISCELLANEOUS) IMPLANT
BASEPLATE TIB CMT FB PCKT SZ4 (Stem) IMPLANT
BLADE SAW SGTL 11.0X1.19X90.0M (BLADE) IMPLANT
BLADE SAW SGTL 13.0X1.19X90.0M (BLADE) ×1 IMPLANT
BNDG ELASTIC 6X5.8 VLCR STR LF (GAUZE/BANDAGES/DRESSINGS) ×1 IMPLANT
BOWL SMART MIX CTS (DISPOSABLE) ×1 IMPLANT
CEMENT HV SMART SET (Cement) IMPLANT
COMP FEM CMT ATTUNE NRW 5 RT (Joint) ×1 IMPLANT
COMPONENT FEM CMT ATTN NRW 5RT (Joint) IMPLANT
COVER SURGICAL LIGHT HANDLE (MISCELLANEOUS) ×1 IMPLANT
CUFF TOURN SGL QUICK 34 (TOURNIQUET CUFF) ×1
CUFF TRNQT CYL 34X4.125X (TOURNIQUET CUFF) ×1 IMPLANT
DERMABOND ADVANCED .7 DNX12 (GAUZE/BANDAGES/DRESSINGS) ×1 IMPLANT
DRAPE U-SHAPE 47X51 STRL (DRAPES) ×1 IMPLANT
DRESSING AQUACEL AG SP 3.5X10 (GAUZE/BANDAGES/DRESSINGS) ×1 IMPLANT
DRSG AQUACEL AG ADV 3.5X10 (GAUZE/BANDAGES/DRESSINGS) IMPLANT
DRSG AQUACEL AG SP 3.5X10 (GAUZE/BANDAGES/DRESSINGS) ×1
DURAPREP 26ML APPLICATOR (WOUND CARE) ×2 IMPLANT
ELECT REM PT RETURN 15FT ADLT (MISCELLANEOUS) ×1 IMPLANT
GLOVE BIO SURGEON STRL SZ 6 (GLOVE) ×1 IMPLANT
GLOVE BIOGEL PI IND STRL 6.5 (GLOVE) ×1 IMPLANT
GLOVE BIOGEL PI IND STRL 7.5 (GLOVE) ×1 IMPLANT
GLOVE ORTHO TXT STRL SZ7.5 (GLOVE) ×2 IMPLANT
GOWN STRL REUS W/ TWL LRG LVL3 (GOWN DISPOSABLE) ×2 IMPLANT
GOWN STRL REUS W/TWL LRG LVL3 (GOWN DISPOSABLE) ×2
HANDPIECE INTERPULSE COAX TIP (DISPOSABLE) ×1
HOLDER FOLEY CATH W/STRAP (MISCELLANEOUS) IMPLANT
INSERT MED ATTUNE KNEE 5 7 RT (Insert) IMPLANT
KIT TURNOVER KIT A (KITS) IMPLANT
MANIFOLD NEPTUNE II (INSTRUMENTS) ×1 IMPLANT
NDL SAFETY ECLIP 18X1.5 (MISCELLANEOUS) IMPLANT
NS IRRIG 1000ML POUR BTL (IV SOLUTION) ×1 IMPLANT
PACK TOTAL KNEE CUSTOM (KITS) ×1 IMPLANT
PIN FIX SIGMA LCS THRD HI (PIN) IMPLANT
PROTECTOR NERVE ULNAR (MISCELLANEOUS) ×1 IMPLANT
SET HNDPC FAN SPRY TIP SCT (DISPOSABLE) ×1 IMPLANT
SET PAD KNEE POSITIONER (MISCELLANEOUS) ×1 IMPLANT
SPIKE FLUID TRANSFER (MISCELLANEOUS) ×2 IMPLANT
SUT MNCRL AB 4-0 PS2 18 (SUTURE) ×1 IMPLANT
SUT STRATAFIX PDS+ 0 24IN (SUTURE) ×1 IMPLANT
SUT VIC AB 1 CT1 36 (SUTURE) ×1 IMPLANT
SUT VIC AB 2-0 CT1 27 (SUTURE) ×2
SUT VIC AB 2-0 CT1 TAPERPNT 27 (SUTURE) ×2 IMPLANT
SYR 3ML LL SCALE MARK (SYRINGE) ×1 IMPLANT
TOWEL GREEN STERILE FF (TOWEL DISPOSABLE) ×1 IMPLANT
TRAY FOLEY MTR SLVR 16FR STAT (SET/KITS/TRAYS/PACK) ×1 IMPLANT
TUBE SUCTION HIGH CAP CLEAR NV (SUCTIONS) ×1 IMPLANT
WATER STERILE IRR 1000ML POUR (IV SOLUTION) ×2 IMPLANT
WRAP KNEE MAXI GEL POST OP (GAUZE/BANDAGES/DRESSINGS) ×1 IMPLANT

## 2022-11-13 NOTE — Anesthesia Procedure Notes (Signed)
Spinal  Patient location during procedure: OR Start time: 11/13/2022 7:18 AM End time: 11/13/2022 7:23 AM Reason for block: surgical anesthesia Staffing Performed: anesthesiologist  Anesthesiologist: Murvin Natal, MD Performed by: Murvin Natal, MD Authorized by: Murvin Natal, MD   Preanesthetic Checklist Completed: patient identified, IV checked, risks and benefits discussed, surgical consent, monitors and equipment checked, pre-op evaluation and timeout performed Spinal Block Patient position: sitting Prep: DuraPrep Patient monitoring: cardiac monitor, continuous pulse ox and blood pressure Approach: midline Location: L4-5 Injection technique: single-shot Needle Needle type: Pencan  Needle gauge: 24 G Needle length: 9 cm Assessment Sensory level: T10 Events: CSF return Additional Notes Functioning IV was confirmed and monitors were applied. Sterile prep and drape, including hand hygiene and sterile gloves were used. The patient was positioned and the spine was prepped. The skin was anesthetized with lidocaine.  Free flow of clear CSF was obtained prior to injecting local anesthetic into the CSF.  The spinal needle aspirated freely following injection.  The needle was carefully withdrawn.  The patient tolerated the procedure well.

## 2022-11-13 NOTE — Discharge Instructions (Signed)

## 2022-11-13 NOTE — Interval H&P Note (Signed)
History and Physical Interval Note:  11/13/2022 7:10 AM  Meagan Walls  has presented today for surgery, with the diagnosis of Right knee osteoarthritis.  The various methods of treatment have been discussed with the patient and family. After consideration of risks, benefits and other options for treatment, the patient has consented to  Procedure(s): TOTAL KNEE ARTHROPLASTY (Right) as a surgical intervention.  The patient's history has been reviewed, patient examined, no change in status, stable for surgery.  I have reviewed the patient's chart and labs.  Questions were answered to the patient's satisfaction.     Mauri Pole

## 2022-11-13 NOTE — Op Note (Signed)
NAME:  Meagan Walls                      MEDICAL RECORD NO.:  [id number]                             FACILITY:  [facility]      PHYSICIAN:  Pietro Cassis. Alvan Dame, M.D.  DATE OF BIRTH:  1956-01-08      DATE OF PROCEDURE:  11/13/2022                                     OPERATIVE REPORT         PREOPERATIVE DIAGNOSIS:  Right knee osteoarthritis.      POSTOPERATIVE DIAGNOSIS:  Right knee osteoarthritis.      FINDINGS:  The patient was noted to have complete loss of cartilage and   bone-on-bone arthritis with associated osteophytes in the medial and patellofemoral compartments of   the knee with a significant synovitis and associated effusion.  The patient had failed months of conservative treatment including medications, injection therapy, activity modification.     PROCEDURE:  Right total knee replacement.      COMPONENTS USED:  DePuy Attune CR MS knee   system, a size 5N femur, 4 tibia, size 7 mm CR MS AOX insert, and 35 anatomic patellar   button.      SURGEON:  Pietro Cassis. Alvan Dame, M.D.      ASSISTANT:  Costella Hatcher, PA-C.      ANESTHESIA:  Regional and Spinal.      SPECIMENS:  None.      COMPLICATION:  None.      DRAINS:  None.  EBL: <100 cc      TOURNIQUET TIME:   Total Tourniquet Time Documented: Thigh (Right) - 26 minutes Total: Thigh (Right) - 26 minutes  .      The patient was stable to the recovery room.      INDICATION FOR PROCEDURE:  Meagan Walls is a 67 y.o. female patient of   mine.  The patient had been seen, evaluated, and treated for months conservatively in the   office with medication, activity modification, and injections.  The patient had   radiographic changes of bone-on-bone arthritis with endplate sclerosis and osteophytes noted.  Based on the radiographic changes and failed conservative measures, the patient   decided to proceed with definitive treatment, total knee replacement.  Risks of infection, DVT, component failure, need for revision surgery,  neurovascular injury were reviewed in the office setting.  The postop course was reviewed stressing the efforts to maximize post-operative satisfaction and function.  Consent was obtained for benefit of pain   relief.      PROCEDURE IN DETAIL:  The patient was brought to the operative theater.   Once adequate anesthesia, preoperative antibiotics, 2 gm of Ancef,1 gm of Tranexamic Acid, and 10 mg of Decadron administered, the patient was positioned supine with a right thigh tourniquet placed.  The  right lower extremity was prepped and draped in sterile fashion.  A time-   out was performed identifying the patient, planned procedure, and the appropriate extremity.      The right lower extremity was placed in the [facility] leg holder.  The leg was   exsanguinated, tourniquet elevated to 225 mmHg.  A midline incision was   made followed by  median parapatellar arthrotomy.  Following initial   exposure, attention was first directed to the patella.  Precut   measurement was noted to be 24 mm.  I resected down to 13-14 mm and used a   35 anatomic patellar button to restore patellar height as well as cover the cut surface.      The lug holes were drilled and a metal shim was placed to protect the   patella from retractors and saw blade during the procedure.      At this point, attention was now directed to the femur.  The femoral   canal was opened with a drill, irrigated to try to prevent fat emboli.  An   intramedullary rod was passed at 3 degrees valgus, 9 mm of bone was   resected off the distal femur.  Following this resection, the tibia was   subluxated anteriorly.  Using the extramedullary guide, 2 mm of bone was resected off   the proximal medial tibia.  We confirmed the gap would be   stable medially and laterally with a size 6 spacer block as well as confirmed that the tibial cut was perpendicular in the coronal plane, checking with an alignment rod.      Once this was done, I sized the femur  to be a size 5 in the anterior-   posterior dimension, chose a narrow component based on medial and   lateral dimension.  The size 5 rotation block was then pinned in   position anterior referenced using the C-clamp to set rotation.  The   anterior, posterior, and  chamfer cuts were made without difficulty nor   notching making certain that I was along the anterior cortex to help   with flexion gap stability.      The final shim cut was made off the lateral aspect of distal femur.      At this point, the tibia was sized to be a size 4.  Rotation of the tibial tray was set by means of floating a trial during passive ROM.  The size 4 tray was   then pinned in position, drilled, and keel punched.  Trial reduction was now carried with a 5 femur,  4 tibia, a size 7 mm CR MS insert, and the 35 anatomic patella botton.  The knee was brought to full extension with good flexion stability with the patella   tracking through the trochlea without application of pressure.  Given   all these findings the trial components removed.  Final components were   opened and cement was mixed.  The knee was irrigated with normal saline solution and pulse lavage.  The synovial lining was   then injected with 30 cc of 0.25% Marcaine with epinephrine, 1 cc of Toradol and 30 cc of NS for a total of 61 cc.     Final implants were then cemented onto cleaned and dried cut surfaces of bone with the knee brought to extension with a size 7 mm CR MS trial insert.      Once the cement had fully cured, excess cement was removed   throughout the knee.  I confirmed that I was satisfied with the range of   motion and stability, and the final size 7 mmCR MS AOX insert was chosen.  It was   placed into the knee.      The tourniquet had been let down at 26 minutes.  No significant   hemostasis was required.  The extensor  mechanism was then reapproximated using #1 Vicryl and #1 Stratafix sutures with the knee   in flexion.  The    remaining wound was closed with 2-0 Vicryl and running 4-0 Monocryl.   The knee was cleaned, dried, dressed sterilely using Dermabond and   Aquacel dressing.  The patient was then   brought to recovery room in stable condition, tolerating the procedure   well.   Please note that Physician Assistant, Costella Hatcher, PA-C was present for the entirety of the case, and was utilized for pre-operative positioning, peri-operative retractor management, general facilitation of the procedure and for primary wound closure at the end of the case.              Pietro Cassis Alvan Dame, M.D.    11/13/2022 8:41 AM

## 2022-11-13 NOTE — Evaluation (Signed)
Physical Therapy Evaluation Patient Details Name: Meagan Walls MRN: [id number] DOB: 08-24-1956 Today's Date: 11/13/2022  History of Present Illness  67 y.o. female admitted 11/13/22 for R TKA. PMH: L TKA 05/2022, CKD, GERD, HTN, prediabetes  Clinical Impression  Pt is s/p TKA resulting in the deficits listed below (see PT Problem List). Pt reports numbness in RLE, no active quadriceps contraction noted RLE, spinal not yet fully worn off. Assisted pt to sit edge of bed for ~15 minutes and initiated TKA HEP. Good progress expected once spinal wears off.  Pt will benefit from skilled PT to increase their independence and safety with mobility to allow discharge to the venue listed below.         Recommendations for follow up therapy are one component of a multi-disciplinary discharge planning process, led by the attending physician.  Recommendations may be updated based on patient status, additional functional criteria and insurance authorization.  Follow Up Recommendations Follow physician's recommendations for discharge plan and follow up therapies      Assistance Recommended at Discharge Intermittent Supervision/Assistance  Patient can return home with the following  A little help with bathing/dressing/bathroom;A little help with walking and/or transfers;Assist for transportation;Help with stairs or ramp for entrance    Equipment Recommendations None recommended by PT  Recommendations for Other Services       Functional Status Assessment Patient has had a recent decline in their functional status and demonstrates the ability to make significant improvements in function in a reasonable and predictable amount of time.     Precautions / Restrictions Precautions Precautions: Knee Precaution Comments: reviewed no pillow under knee Restrictions Weight Bearing Restrictions: No      Mobility  Bed Mobility Overal bed mobility: Needs Assistance Bed Mobility: Supine to Sit     Supine  to sit: Min assist, HOB elevated     General bed mobility comments: assist to raise trunk; sat edge of bed 15 minutes    Transfers                   General transfer comment: unable due to spinal not being worn off, RLE numb, no quad activation    Ambulation/Gait               General Gait Details: unable  Stairs            Wheelchair Mobility    Modified Rankin (Stroke Patients Only)       Balance Overall balance assessment: Needs assistance   Sitting balance-Leahy Scale: Good       Standing balance-Leahy Scale: Zero                               Pertinent Vitals/Pain Pain Assessment Pain Assessment: 0-10 Pain Score: 0-No pain Pain Intervention(s): Premedicated before session    Home Living Family/patient expects to be discharged to:: Private residence Living Arrangements: Spouse/significant other Available Help at Discharge: Family;Available 24 hours/day Type of Home: House Home Access: Stairs to enter Entrance Stairs-Rails: None Entrance Stairs-Number of Steps: 5   Home Layout: One level Home Equipment: Shower seat - built in;Toilet riser;Cane - single point;Rolling Walker (2 wheels)      Prior Function Prior Level of Function : Independent/Modified Independent               ADLs Comments: ind     Hand Dominance        Extremity/Trunk Assessment   Upper  Extremity Assessment Upper Extremity Assessment: Overall WFL for tasks assessed    Lower Extremity Assessment Lower Extremity Assessment: RLE deficits/detail RLE Deficits / Details: unable to perform quad set, no active knee extension RLE Sensation: decreased light touch    Cervical / Trunk Assessment Cervical / Trunk Assessment: Normal  Communication   Communication: No difficulties  Cognition Arousal/Alertness: Awake/alert Behavior During Therapy: WFL for tasks assessed/performed Overall Cognitive Status: Within Functional Limits for tasks  assessed                                          General Comments      Exercises Total Joint Exercises Ankle Circles/Pumps: AROM, Both, 10 reps, Supine Quad Sets: AROM, Left, 5 reps, Supine, Limitations Quad Sets Limitations: no active firing of R quads noted, spinal not yet worn off Heel Slides: AAROM, Right, 5 reps, Supine   Assessment/Plan    PT Assessment Patient needs continued PT services  PT Problem List Decreased strength;Decreased mobility;Decreased range of motion;Decreased activity tolerance;Impaired sensation       PT Treatment Interventions DME instruction;Functional mobility training;Patient/family education;Gait training;Therapeutic activities;Therapeutic exercise;Stair training    PT Goals (Current goals can be found in the Care Plan section)  Acute Rehab PT Goals Patient Stated Goal: walking, golf, do stairs step over step PT Goal Formulation: With patient Time For Goal Achievement: 11/20/22 Potential to Achieve Goals: Good    Frequency 7X/week     Co-evaluation               AM-PAC PT "6 Clicks" Mobility  Outcome Measure Help needed turning from your back to your side while in a flat bed without using bedrails?: A Little Help needed moving from lying on your back to sitting on the side of a flat bed without using bedrails?: A Little Help needed moving to and from a bed to a chair (including a wheelchair)?: Total Help needed standing up from a chair using your arms (e.g., wheelchair or bedside chair)?: Total Help needed to walk in hospital room?: Total Help needed climbing 3-5 steps with a railing? : Total 6 Click Score: 10    End of Session   Activity Tolerance: Patient tolerated treatment well Patient left: in bed;with bed alarm set;with call bell/phone within reach Nurse Communication: Mobility status PT Visit Diagnosis: Difficulty in walking, not elsewhere classified (R26.2);Muscle weakness (generalized) (M62.81)     Time: [id number] PT Time Calculation (min) (ACUTE ONLY): 33 min   Charges:   PT Evaluation $PT Eval Moderate Complexity: 1 Mod PT Treatments $Therapeutic Activity: 8-22 mins        Blondell Reveal Kistler PT 11/13/2022  Acute Rehabilitation Services  Office [phone]

## 2022-11-13 NOTE — Anesthesia Preprocedure Evaluation (Addendum)
Anesthesia Evaluation  Patient identified by MRN, date of birth, ID band Patient awake    Reviewed: Allergy & Precautions, NPO status , Patient's Chart, lab work & pertinent test results  History of Anesthesia Complications (+) PONV and history of anesthetic complications  Airway Mallampati: III  TM Distance: >3 FB Neck ROM: Full    Dental no notable dental hx.    Pulmonary sleep apnea    Pulmonary exam normal        Cardiovascular hypertension, Pt. on medications Normal cardiovascular exam     Neuro/Psych negative neurological ROS  negative psych ROS   GI/Hepatic Neg liver ROS,GERD  Medicated and Controlled,,  Endo/Other    Morbid obesityPre-DM  Renal/GU negative Renal ROS     Musculoskeletal  (+) Arthritis ,    Abdominal  (+) + obese  Peds  Hematology negative hematology ROS (+)   Anesthesia Other Findings Right knee osteoarthritis  Reproductive/Obstetrics                             Anesthesia Physical Anesthesia Plan  ASA: 3  Anesthesia Plan: Regional and Spinal   Post-op Pain Management:    Induction: Intravenous  PONV Risk Score and Plan: 3 and Ondansetron, Dexamethasone, Propofol infusion, Midazolam and Treatment may vary due to age or medical condition  Airway Management Planned: Simple Face Mask  Additional Equipment:   Intra-op Plan:   Post-operative Plan:   Informed Consent: I have reviewed the patients History and Physical, chart, labs and discussed the procedure including the risks, benefits and alternatives for the proposed anesthesia with the patient or authorized representative who has indicated his/her understanding and acceptance.     Dental advisory given  Plan Discussed with: CRNA  Anesthesia Plan Comments:        Anesthesia Quick Evaluation

## 2022-11-13 NOTE — Plan of Care (Signed)
  Problem: Education: Goal: Knowledge of General Education information will improve Description Including pain rating scale, medication(s)/side effects and non-pharmacologic comfort measures Outcome: Progressing   

## 2022-11-13 NOTE — Anesthesia Postprocedure Evaluation (Signed)
Anesthesia Post Note  Patient: Meagan Walls  Procedure(s) Performed: TOTAL KNEE ARTHROPLASTY (Right: Knee)     Patient location during evaluation: PACU Anesthesia Type: Regional and Spinal Level of consciousness: awake Pain management: pain level controlled Vital Signs Assessment: post-procedure vital signs reviewed and stable Respiratory status: spontaneous breathing, nonlabored ventilation and respiratory function stable Cardiovascular status: blood pressure returned to baseline and stable Postop Assessment: no apparent nausea or vomiting Anesthetic complications: no   No notable events documented.  Last Vitals:  Vitals:   11/13/22 1147 11/13/22 1411  BP: (!) 144/82 118/72  Pulse: 79 83  Resp: 16 18  Temp: 36.5 C 36.8 C  SpO2: 95% 97%    Last Pain:  Vitals:   11/13/22 1411  TempSrc: Oral  PainSc:                  [name] P Meriah Shands

## 2022-11-13 NOTE — Anesthesia Procedure Notes (Signed)
Anesthesia Regional Block: Adductor canal block   Pre-Anesthetic Checklist: , timeout performed,  Correct Patient, Correct Site, Correct Laterality,  Correct Procedure,, site marked,  Risks and benefits discussed,  Surgical consent,  Pre-op evaluation,  At surgeon's request and post-op pain management  Laterality: Right  Prep: chloraprep       Needles:  Injection technique: Single-shot  Needle Type: Echogenic Stimulator Needle     Needle Length: 10cm  Needle Gauge: 20     Additional Needles:   Procedures:,,,, ultrasound used (permanent image in chart),,    Narrative:  Start time: 11/13/2022 6:50 AM End time: 11/13/2022 7:00 AM Injection made incrementally with aspirations every 5 mL.  Performed by: Personally  Anesthesiologist: Murvin Natal, MD  Additional Notes: Functioning IV was confirmed and monitors were applied. A time-out was performed. Hand hygiene and sterile gloves were used. The thigh was placed in a frog-leg position and prepped in a sterile fashion. A 150m 20ga Bbraun echogenic stimulator needle was placed using ultrasound guidance.  Negative aspiration and negative test dose prior to incremental administration of local anesthetic. The patient tolerated the procedure well.

## 2022-11-13 NOTE — Transfer of Care (Signed)
Immediate Anesthesia Transfer of Care Note  Patient: Meagan Walls  Procedure(s) Performed: TOTAL KNEE ARTHROPLASTY (Right: Knee)  Patient Location: PACU  Anesthesia Type:Spinal  Level of Consciousness: sedated  Airway & Oxygen Therapy: Patient Spontanous Breathing and Patient connected to face mask oxygen  Post-op Assessment: Report given to RN and Post -op Vital signs reviewed and stable  Post vital signs: Reviewed and stable  Last Vitals:  Vitals Value Taken Time  BP 124/66 11/13/22 0900  Temp    Pulse 81 11/13/22 0901  Resp 18 11/13/22 0901  SpO2 94 % 11/13/22 0901  Vitals shown include unvalidated device data.  Last Pain:  Vitals:   11/13/22 0615  TempSrc:   PainSc: 0-No pain         Complications: No notable events documented.

## 2022-11-14 DIAGNOSIS — M1711 Unilateral primary osteoarthritis, right knee: Secondary | ICD-10-CM | POA: Diagnosis not present

## 2022-11-14 LAB — BASIC METABOLIC PANEL
Anion gap: 9 (ref 5–15)
BUN: 23 mg/dL (ref 8–23)
CO2: 20 mmol/L — ABNORMAL LOW (ref 22–32)
Calcium: 8.3 mg/dL — ABNORMAL LOW (ref 8.9–10.3)
Chloride: 103 mmol/L (ref 98–111)
Creatinine, Ser: 1.03 mg/dL — ABNORMAL HIGH (ref 0.44–1.00)
GFR, Estimated: 60 mL/min — ABNORMAL LOW (ref >60)
Glucose, Bld: 151 mg/dL — ABNORMAL HIGH (ref 70–99)
Potassium: 4.1 mmol/L (ref 3.5–5.1)
Sodium: 132 mmol/L — ABNORMAL LOW (ref 135–145)

## 2022-11-14 LAB — CBC
HCT: 33.7 % — ABNORMAL LOW (ref 36.0–46.0)
Hemoglobin: 11.2 g/dL — ABNORMAL LOW (ref 12.0–15.0)
MCH: 30.9 pg (ref 26.0–34.0)
MCHC: 33.2 g/dL (ref 30.0–36.0)
MCV: 92.8 fL (ref 80.0–100.0)
Platelets: 220 10*3/uL (ref 150–400)
RBC: 3.63 MIL/uL — ABNORMAL LOW (ref 3.87–5.11)
RDW: 12.8 % (ref 11.5–15.5)
WBC: 12.8 10*3/uL — ABNORMAL HIGH (ref 4.0–10.5)
nRBC: 0 % (ref 0.0–0.2)

## 2022-11-14 MED ORDER — OXYCODONE HCL 5 MG PO TABS: 5.0000 mg | ORAL_TABLET | ORAL | 0 refills | Status: AC | PRN

## 2022-11-14 MED ORDER — ASPIRIN 81 MG PO CHEW: 81.0000 mg | CHEWABLE_TABLET | Freq: Two times a day (BID) | ORAL | 0 refills | Status: AC

## 2022-11-14 MED ORDER — TIZANIDINE HCL 2 MG PO TABS: 2.0000 mg | ORAL_TABLET | Freq: Three times a day (TID) | ORAL | 2 refills | Status: AC | PRN

## 2022-11-14 MED ORDER — CELECOXIB 200 MG PO CAPS: 200.0000 mg | ORAL_CAPSULE | Freq: Two times a day (BID) | ORAL | 0 refills | Status: AC

## 2022-11-14 MED ORDER — TRANEXAMIC ACID 650 MG PO TABS: 1950.0000 mg | ORAL_TABLET | Freq: Every day | ORAL | 0 refills | Status: AC

## 2022-11-14 MED ORDER — TRANEXAMIC ACID 650 MG PO TABS
1950.0000 mg | ORAL_TABLET | Freq: Every day | ORAL | Status: DC
  Administered 2022-11-14: 1950 mg via ORAL
  Filled 2022-11-14: qty 3

## 2022-11-14 MED ORDER — ONDANSETRON HCL 4 MG PO TABS: 4.0000 mg | ORAL_TABLET | Freq: Four times a day (QID) | ORAL | 0 refills | Status: AC | PRN

## 2022-11-14 MED ORDER — SENNA 8.6 MG PO TABS: 1.0000 | ORAL_TABLET | Freq: Every day | ORAL | 0 refills | Status: AC

## 2022-11-14 MED ORDER — POLYETHYLENE GLYCOL 3350 17 G PO PACK: 17.0000 g | PACK | Freq: Two times a day (BID) | ORAL | 0 refills | Status: AC

## 2022-11-14 NOTE — Plan of Care (Signed)

## 2022-11-14 NOTE — Plan of Care (Signed)

## 2022-11-14 NOTE — Progress Notes (Signed)
Physical Therapy Treatment Patient Details Name: Meagan Walls MRN: [id number] DOB: [birth year] Today's Date: 11/14/2022   History of Present Illness 67 y.o. female admitted 11/13/22 for R TKA. PMH: L TKA 05/2022, CKD, GERD, HTN, prediabetes    PT Comments    Pt is progressing well with mobility, she ambulated 125' with RW, no loss of balance nor buckling. Stair training completed with pt's spouse present and assisting to stabilize RW. Reviewed HEP, pt/spouse demonstrate good understanding. She is ready to DC home from a PT standpoint.     Recommendations for follow up therapy are one component of a multi-disciplinary discharge planning process, led by the attending physician.  Recommendations may be updated based on patient status, additional functional criteria and insurance authorization.  Follow Up Recommendations  Follow physician's recommendations for discharge plan and follow up therapies     Assistance Recommended at Discharge Intermittent Supervision/Assistance  Patient can return home with the following A little help with bathing/dressing/bathroom;A little help with walking and/or transfers;Assist for transportation;Help with stairs or ramp for entrance   Equipment Recommendations  None recommended by PT    Recommendations for Other Services       Precautions / Restrictions Precautions Precautions: Knee Precaution Booklet Issued: Yes (comment) Precaution Comments: reviewed no pillow under knee; locked legs of bed in flat position as they were in bent position at start of session Restrictions Weight Bearing Restrictions: No RLE Weight Bearing: Weight bearing as tolerated     Mobility  Bed Mobility Overal bed mobility: Needs Assistance Bed Mobility: Supine to Sit     Supine to sit: Supervision, HOB elevated     General bed mobility comments: used gait belt as leg lifter, VCs technique    Transfers Overall transfer level: Needs assistance Equipment used:  Rolling walker (2 wheels) Transfers: Sit to/from Stand Sit to Stand: Min guard, From elevated surface           General transfer comment: VCs hand placement    Ambulation/Gait Ambulation/Gait assistance: Supervision Gait Distance (Feet): 125 Feet Assistive device: Rolling walker (2 wheels) Gait Pattern/deviations: Step-to pattern, Decreased step length - right, Decreased step length - left Gait velocity: decr     General Gait Details: VCs sequencing, no buckling, no loss of balance   Stairs Stairs: Yes Stairs assistance: Min assist Stair Management: No rails, Backwards, Step to pattern, With walker Number of Stairs: 3 General stair comments: VCs sequencing, spouse Mac present and assisted with steadying RW, Min A for managing RW   Wheelchair Mobility    Modified Rankin (Stroke Patients Only)       Balance Overall balance assessment: Needs assistance   Sitting balance-Leahy Scale: Good     Standing balance support: Bilateral upper extremity supported, During functional activity, Reliant on assistive device for balance Standing balance-Leahy Scale: Poor                              Cognition Arousal/Alertness: Awake/alert Behavior During Therapy: WFL for tasks assessed/performed Overall Cognitive Status: Within Functional Limits for tasks assessed                                          Exercises Total Joint Exercises Ankle Circles/Pumps: AROM, Both, 10 reps, Supine Quad Sets: AROM, 5 reps, Supine, Both Short Arc Quad: AROM, Right, 5 reps, Supine Heel Slides: AAROM,  Right, Supine, 10 reps Hip ABduction/ADduction: AROM, Right, 10 reps, Supine Straight Leg Raises: AAROM, Right, 5 reps, Supine Long Arc Quad: AROM, Right, 5 reps, Seated Knee Flexion: AAROM, Right, 10 reps, Seated Goniometric ROM: 5-65* AAROM R knee    General Comments        Pertinent Vitals/Pain Pain Assessment Pain Score: 6  Pain Location: R knee Pain  Descriptors / Indicators: Sore, Operative site guarding Pain Intervention(s): Limited activity within patient's tolerance, Monitored during session, Premedicated before session, Ice applied    Home Living                          Prior Function            PT Goals (current goals can now be found in the care plan section) Acute Rehab PT Goals Patient Stated Goal: walking, golf, do stairs step over step PT Goal Formulation: With patient/family Time For Goal Achievement: 11/20/22 Potential to Achieve Goals: Good Progress towards PT goals: Progressing toward goals    Frequency    7X/week      PT Plan Current plan remains appropriate    Co-evaluation              AM-PAC PT "6 Clicks" Mobility   Outcome Measure  Help needed turning from your back to your side while in a flat bed without using bedrails?: None Help needed moving from lying on your back to sitting on the side of a flat bed without using bedrails?: A Little Help needed moving to and from a bed to a chair (including a wheelchair)?: None Help needed standing up from a chair using your arms (e.g., wheelchair or bedside chair)?: None Help needed to walk in hospital room?: A Little Help needed climbing 3-5 steps with a railing? : A Little 6 Click Score: 21    End of Session Equipment Utilized During Treatment: Gait belt Activity Tolerance: Patient tolerated treatment well Patient left: with call bell/phone within reach;in chair;with family/visitor present Nurse Communication: Mobility status PT Visit Diagnosis: Difficulty in walking, not elsewhere classified (R26.2);Muscle weakness (generalized) (M62.81)     Time: [id number] PT Time Calculation (min) (ACUTE ONLY): 50 min  Charges:  $Gait Training: 8-22 mins $Therapeutic Exercise: 8-22 mins $Therapeutic Activity: 8-22 mins                    Blondell Reveal Kistler PT 11/14/2022  Acute Rehabilitation Services  Office [phone]

## 2022-11-14 NOTE — TOC Transition Note (Signed)
Transition of Care [facility]) - CM/SW Discharge Note  Patient Details  Name: Meagan Walls MRN: [id number] Date of Birth: Jan 23, 1956  Transition of Care [facility]) CM/SW Contact:  Sherie Don, LCSW Phone Number: 11/14/2022, 10:24 AM  Clinical Narrative: Patient is expected to discharge home after working with PT. CSW met with patient to confirm discharge plan. Patient will discharge home with OPPT at [facility]. Patient has a rolling walker, cane, and ice machine at home so there are no DME needs at this time. TOC signing off.  Final next level of care: OP Rehab Barriers to Discharge: No Barriers Identified  Patient Goals and CMS Choice Choice offered to / list presented to : NA  Discharge Plan and Services Additional resources added to the After Visit Summary for        DME Arranged: N/A DME Agency: NA  Social Determinants of Health (SDOH) Interventions SDOH Screenings   Food Insecurity: Unknown (11/13/2022)  Housing: Low Risk  (11/13/2022)  Transportation Needs: Unknown (11/13/2022)  Utilities: Unknown (11/13/2022)  Tobacco Use: Low Risk  (11/13/2022)   Readmission Risk Interventions     No data to display

## 2022-11-14 NOTE — Progress Notes (Signed)
   Subjective: 1 Day Post-Op Procedure(s) (LRB): TOTAL KNEE ARTHROPLASTY (Right) Patient reports pain as mild.   Patient seen in rounds for Dr. Alvan Dame. Patient is well, and has had no acute complaints or problems. No acute events overnight. Foley catheter removed. Patient sat at edge of bed with PT yesterday. We will start therapy today.   Objective: Vital signs in last 24 hours: Temp:  [97.6 F (36.4 C)-98.3 F (36.8 C)] 97.6 F (36.4 C) (02/14 0634) Pulse Rate:  [61-86] 61 (02/14 0634) Resp:  [14-20] 18 (02/14 0634) BP: (107-148)/(66-96) 145/78 (02/14 0634) SpO2:  [94 %-98 %] 98 % (02/14 0634)  Intake/Output from previous day:  Intake/Output Summary (Last 24 hours) at 11/14/2022 0843 Last data filed at 11/14/2022 0634 Gross per 24 hour  Intake 2550.88 ml  Output 2100 ml  Net 450.88 ml     Intake/Output this shift: No intake/output data recorded.  Labs: Recent Labs    11/14/22 0355  HGB 11.2*   Recent Labs    11/14/22 0355  WBC 12.8*  RBC 3.63*  HCT 33.7*  PLT 220   Recent Labs    11/14/22 0355  NA 132*  K 4.1  CL 103  CO2 20*  BUN 23  CREATININE 1.03*  GLUCOSE 151*  CALCIUM 8.3*   No results for input(s): "LABPT", "INR" in the last 72 hours.  Exam: General - Patient is Alert and Oriented Extremity - Neurologically intact Sensation intact distally Intact pulses distally Dorsiflexion/Plantar flexion intact Dressing - dressing C/D/I Motor Function - intact, moving foot and toes well on exam.   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   Weight as of this encounter: 103 kg. Advance diet Up with therapy D/C IV fluids   Patient's anticipated LOS is less than 2 midnights, meeting these requirements: - Younger than 58 - Lives within 1 hour of care - Has a competent adult at home to recover with post-op recover - NO history of  - Chronic pain requiring opiods  - Diabetes  - Coronary Artery Disease  - Heart failure  - Heart attack  - Stroke  - DVT/VTE  - Cardiac arrhythmia  - Respiratory Failure/COPD  - Renal failure  - Anemia  - Advanced Liver disease     DVT Prophylaxis - Aspirin Weight bearing as tolerated.  Hgb stable at 11.2 this AM.  Plan is to go Home after hospital stay. Plan for discharge today following 1-2 sessions of PT as long as they are meeting their goals. Patient is scheduled for OPPT. Follow up in the office in 2 weeks.   Griffith Citron, PA-C Orthopedic Surgery [phone] 11/14/2022, 8:43 AM

## 2022-11-15 ENCOUNTER — Encounter (HOSPITAL_COMMUNITY): Payer: Self-pay | Admitting: Orthopedic Surgery

## 2022-11-27 NOTE — Discharge Summary (Signed)
Patient ID: Meagan Walls MRN: [id number] DOB/AGE: Oct 07, 1955 67 y.o.  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 female who was admitted 11/13/2022 for operative treatment ofS/P total knee arthroplasty, right. Patient has severe unremitting pain that affects sleep, daily activities, and work/hobbies. After pre-op clearance the patient was taken to the operating room on 11/13/2022 and underwent  Procedure(s): TOTAL KNEE ARTHROPLASTY.    Patient was given perioperative antibiotics:  Anti-infectives (From admission, onward)    Start     Dose/Rate Route Frequency Ordered Stop   11/13/22 1330  ceFAZolin (ANCEF) IVPB 2g/100 mL premix        2 g 200 mL/hr over 30 Minutes Intravenous Every 6 hours 11/13/22 1046 11/13/22 2117   11/13/22 1130  ceFAZolin (ANCEF) IVPB 2g/100 mL premix  Status:  Discontinued        2 g 200 mL/hr over 30 Minutes Intravenous Every 6 hours 11/13/22 1042 11/13/22 1046   11/13/22 0600  ceFAZolin (ANCEF) IVPB 2g/100 mL premix        2 g 200 mL/hr over 30 Minutes Intravenous On call to O.R. 11/13/22 [id number] 11/13/22 0724        Patient was given sequential compression devices, early ambulation, and chemoprophylaxis to prevent DVT. Patient worked with PT and was meeting their goals regarding safe ambulation and transfers.  Patient benefited maximally from  hospital stay and there were no complications.    Recent vital signs: No data found.   Recent laboratory studies: No results for input(s): "WBC", "HGB", "HCT", "PLT", "NA", "K", "CL", "CO2", "BUN", "CREATININE", "GLUCOSE", "INR", "CALCIUM" in the last 72 hours.  Invalid input(s): "PT", "2"   Discharge Medications:   Allergies as of 11/14/2022       Reactions   Cetirizine Hcl Other (See Comments)   Mouth sores   Erythromycin Base Nausea And Vomiting   Levofloxacin Other (See Comments)   Muscle pain   Hydromorphone Nausea Only   Molds & Smuts    Short Ragweed Pollen Ext    Tape Rash   When left on for extended periods         Medication List     STOP taking these medications    diclofenac 75 MG EC tablet Commonly known as: VOLTAREN   docusate sodium 100 MG capsule Commonly known as: COLACE   methocarbamol 500 MG tablet Commonly known as: ROBAXIN       TAKE these medications    acetaminophen 500 MG tablet Commonly known as: TYLENOL Take 2 tablets (1,000 mg total) by mouth every 6 (six) hours. What changed:  when to take this reasons to take this   albuterol 108 (90 Base) MCG/ACT inhaler Commonly known as: VENTOLIN HFA Inhale 2 puffs into the lungs every 6 (six) hours as needed for wheezing or shortness of breath.   aspirin 81  MG chewable tablet Chew 1 tablet (81 mg total) by mouth 2 (two) times daily for 28 days. What changed: when to take this   atorvastatin 10 MG tablet Commonly known as: LIPITOR Take 10 mg by mouth every evening.   celecoxib 200 MG capsule Commonly known as: CELEBREX Take 1 capsule (200 mg total) by mouth 2 (two) times daily.   diphenhydrAMINE 25 MG tablet Commonly known as: BENADRYL Take 25 mg by mouth at bedtime.   famotidine-calcium carbonate-magnesium hydroxide 10-800-165 MG chewable tablet Commonly known as: PEPCID COMPLETE Chew 1 tablet by mouth daily as needed (acid reflux).   fexofenadine 180 MG tablet Commonly  known as: ALLEGRA Take 180 mg by mouth daily.   lisinopril-hydrochlorothiazide 20-25 MG tablet Commonly known as: ZESTORETIC Take 1 tablet by mouth daily.   Melatonin 5 MG Caps Take 5 mg by mouth at bedtime.   metFORMIN 500 MG tablet Commonly known as: GLUCOPHAGE Take 500 mg by mouth daily.   Mucinex Maximum Strength 1200 MG Tb12 Generic drug: Guaifenesin Take 1,200 mg by mouth daily as needed (congestion).   Nasacort Allergy 24HR 55 MCG/ACT Aero nasal inhaler Generic drug: triamcinolone Place 2 sprays into the nose every evening.   omeprazole 20 MG capsule Commonly known as: PRILOSEC Take 20 mg by mouth daily.   ondansetron 4 MG tablet Commonly known as: ZOFRAN Take 1 tablet (4 mg total) by mouth every 6 (six) hours as needed for nausea.   oxyCODONE 5 MG immediate release tablet Commonly known as: Oxy IR/ROXICODONE Take 1-2 tablets (5-10 mg total) by mouth every 4 (four) hours as needed for severe pain. Start with 1 tablet every 4 hours as needed. Use 2 tabs only for severe pain   oxymetazoline 0.05 % nasal spray Commonly known as: AFRIN Place 1 spray into both nostrils 2 (two) times daily as needed for congestion.   polyethylene glycol 17 g packet Commonly known as: MIRALAX / GLYCOLAX Take 17 g by mouth 2 (two) times daily. What changed:  when to take this reasons to take this   senna 8.6 MG Tabs tablet Commonly known as: SENOKOT Take 1 tablet (8.6 mg total) by mouth at bedtime for 14 days.   tiZANidine 2 MG tablet Commonly known as: ZANAFLEX Take 1 tablet (2 mg total) by mouth every 8 (eight) hours as needed for muscle spasms.   TURMERIC PO Take 1,000 mg by mouth 2 (two) times daily.   Vitamin D 50 MCG (2000 UT) tablet Take 4,000 Units by mouth daily.   zolpidem 10 MG tablet Commonly known as: AMBIEN Take 5 mg by mouth at bedtime.       ASK your doctor about these medications    tranexamic acid 650 MG Tabs tablet Commonly known as: LYSTEDA Take  3 tablets (1,950 mg total) by mouth daily for 2 days. Start on Thursday Ask about: Should I take this medication?               Discharge Care Instructions  (From admission, onward)           Start     Ordered   11/14/22 0000  Change dressing       Comments: Maintain surgical dressing until follow up in the clinic. If the edges start to pull up, may reinforce with tape. If the dressing is no longer working, may remove and cover with gauze and tape, but must keep the area dry and clean.  Call with any questions or concerns.  11/14/22 0846            Diagnostic Studies: No results found.  Disposition: Discharge disposition: 01-Home or Self Care       Discharge Instructions     Call MD / Call 911   Complete by: As directed    If you experience chest pain or shortness of breath, CALL 911 and be transported to the hospital emergency room.  If you develope a fever above 101 F, pus (white drainage) or increased drainage or redness at the wound, or calf pain, call your surgeon's office.   Change dressing   Complete by: As directed    Maintain surgical dressing until follow up in the clinic. If the edges start to pull up, may reinforce with tape. If the dressing is no longer working, may remove and cover with gauze and tape, but must keep the area dry and clean.  Call with any questions or concerns.   Constipation Prevention   Complete by: As directed    Drink plenty of fluids.  Prune juice may be helpful.  You may use a stool softener, such as Colace (over the counter) 100 mg twice a day.  Use MiraLax (over the counter) for constipation as needed.   Diet - low sodium heart healthy   Complete by: As directed    Increase activity slowly as tolerated   Complete by: As directed    Weight bearing as tolerated with assist device (walker, cane, etc) as directed, use it as long as suggested by your surgeon or therapist, typically at least 4-6 weeks.   Post-operative opioid  taper instructions:   Complete by: As directed    POST-OPERATIVE OPIOID TAPER INSTRUCTIONS: It is important to wean off of your opioid medication as soon as possible. If you do not need pain medication after your surgery it is ok to stop day one. Opioids include: Codeine, Hydrocodone(Norco, Vicodin), Oxycodone(Percocet, oxycontin) and hydromorphone amongst others.  Long term and even short term use of opiods can cause: Increased pain response Dependence Constipation Depression Respiratory depression And more.  Withdrawal symptoms can include Flu like symptoms Nausea, vomiting And more Techniques to manage these symptoms Hydrate well Eat regular healthy meals Stay active Use relaxation techniques(deep breathing, meditating, yoga) Do Not substitute Alcohol to help with tapering If you have been on opioids for less than two weeks and do not have pain than it is ok to stop all together.  Plan to wean off of opioids This plan should start within one week post op of your joint replacement. Maintain the same interval or time between taking each dose and first decrease the dose.  Cut the total daily intake of opioids by one tablet each day Next start to increase the time between doses. The last dose that should be eliminated is the evening dose.      TED hose   Complete by: As directed    Use stockings (TED hose) for 2 weeks on both leg(s).  You may remove them at night for sleeping.        Follow-up Information     Paralee Cancel, MD. Schedule an appointment as soon as possible for a visit in 2 week(s).   Specialty: Orthopedic Surgery Contact information: [address] [place] [address] [id number]                  Signed: Irving Copas 11/27/2022, 8:00 AM
